# Patient Record
Sex: Female | Born: 1970
Health system: Southern US, Community
[De-identification: ages and names within clinical notes are randomized; demographics above are authoritative.]

## PROBLEM LIST (undated history)

## (undated) DIAGNOSIS — E559 Vitamin D deficiency, unspecified: Secondary | ICD-10-CM

## (undated) DIAGNOSIS — Z98811 Dental restoration status: Secondary | ICD-10-CM

## (undated) DIAGNOSIS — M1990 Unspecified osteoarthritis, unspecified site: Secondary | ICD-10-CM

## (undated) DIAGNOSIS — K219 Gastro-esophageal reflux disease without esophagitis: Secondary | ICD-10-CM

## (undated) DIAGNOSIS — N649 Disorder of breast, unspecified: Secondary | ICD-10-CM

## (undated) HISTORY — DX: Unspecified osteoarthritis, unspecified site: M19.90

## (undated) HISTORY — DX: Vitamin D deficiency, unspecified: E55.9

## (undated) HISTORY — PX: INTRAUTERINE DEVICE (IUD) INSERTION: SHX5877

## (undated) HISTORY — DX: Gastro-esophageal reflux disease without esophagitis: K21.9

---

## 1989-10-19 HISTORY — PX: KNEE ARTHROSCOPY: SHX127

## 1999-01-17 ENCOUNTER — Ambulatory Visit (HOSPITAL_COMMUNITY): Admission: RE | Admit: 1999-01-17 | Discharge: 1999-01-17 | Payer: Self-pay | Admitting: Oral and Maxillofacial Surgery

## 1999-01-17 ENCOUNTER — Encounter: Payer: Self-pay | Admitting: Oral and Maxillofacial Surgery

## 1999-03-11 ENCOUNTER — Other Ambulatory Visit: Admission: RE | Admit: 1999-03-11 | Discharge: 1999-03-11 | Payer: Self-pay | Admitting: Gynecology

## 2000-03-10 ENCOUNTER — Other Ambulatory Visit: Admission: RE | Admit: 2000-03-10 | Discharge: 2000-03-10 | Payer: Self-pay | Admitting: Gynecology

## 2001-03-17 ENCOUNTER — Other Ambulatory Visit: Admission: RE | Admit: 2001-03-17 | Discharge: 2001-03-17 | Payer: Self-pay | Admitting: Gynecology

## 2001-05-06 ENCOUNTER — Encounter: Admission: RE | Admit: 2001-05-06 | Discharge: 2001-05-06 | Payer: Self-pay | Admitting: Emergency Medicine

## 2001-05-06 ENCOUNTER — Encounter: Payer: Self-pay | Admitting: Emergency Medicine

## 2001-05-18 ENCOUNTER — Ambulatory Visit: Admission: RE | Admit: 2001-05-18 | Discharge: 2001-05-18 | Payer: Self-pay | Admitting: Emergency Medicine

## 2002-05-23 ENCOUNTER — Other Ambulatory Visit: Admission: RE | Admit: 2002-05-23 | Discharge: 2002-05-23 | Payer: Self-pay | Admitting: Gynecology

## 2003-06-15 ENCOUNTER — Other Ambulatory Visit: Admission: RE | Admit: 2003-06-15 | Discharge: 2003-06-15 | Payer: Self-pay | Admitting: Gynecology

## 2003-06-23 ENCOUNTER — Emergency Department (HOSPITAL_COMMUNITY): Admission: EM | Admit: 2003-06-23 | Discharge: 2003-06-23 | Payer: Self-pay | Admitting: Emergency Medicine

## 2003-11-02 ENCOUNTER — Encounter: Admission: RE | Admit: 2003-11-02 | Discharge: 2003-11-02 | Payer: Self-pay | Admitting: Emergency Medicine

## 2004-07-29 ENCOUNTER — Other Ambulatory Visit: Admission: RE | Admit: 2004-07-29 | Discharge: 2004-07-29 | Payer: Self-pay | Admitting: Gynecology

## 2005-01-26 ENCOUNTER — Other Ambulatory Visit: Admission: RE | Admit: 2005-01-26 | Discharge: 2005-01-26 | Payer: Self-pay | Admitting: Gynecology

## 2005-06-16 ENCOUNTER — Other Ambulatory Visit: Admission: RE | Admit: 2005-06-16 | Discharge: 2005-06-16 | Payer: Self-pay | Admitting: Gynecology

## 2005-08-03 ENCOUNTER — Encounter: Admission: RE | Admit: 2005-08-03 | Discharge: 2005-08-03 | Payer: Self-pay | Admitting: General Surgery

## 2005-08-09 ENCOUNTER — Encounter: Admission: RE | Admit: 2005-08-09 | Discharge: 2005-08-09 | Payer: Self-pay | Admitting: General Surgery

## 2005-08-27 ENCOUNTER — Other Ambulatory Visit: Admission: RE | Admit: 2005-08-27 | Discharge: 2005-08-27 | Payer: Self-pay | Admitting: Gynecology

## 2006-02-01 ENCOUNTER — Encounter: Admission: RE | Admit: 2006-02-01 | Discharge: 2006-02-01 | Payer: Self-pay | Admitting: Gynecology

## 2006-07-07 ENCOUNTER — Encounter: Admission: RE | Admit: 2006-07-07 | Discharge: 2006-07-07 | Payer: Self-pay | Admitting: Emergency Medicine

## 2006-09-01 ENCOUNTER — Other Ambulatory Visit: Admission: RE | Admit: 2006-09-01 | Discharge: 2006-09-01 | Payer: Self-pay | Admitting: Gynecology

## 2007-09-08 ENCOUNTER — Other Ambulatory Visit: Admission: RE | Admit: 2007-09-08 | Discharge: 2007-09-08 | Payer: Self-pay | Admitting: Gynecology

## 2008-07-13 ENCOUNTER — Ambulatory Visit: Payer: Self-pay | Admitting: Gynecology

## 2008-07-17 ENCOUNTER — Ambulatory Visit: Payer: Self-pay | Admitting: Gynecology

## 2008-07-17 HISTORY — PX: INTRAUTERINE DEVICE INSERTION: SHX323

## 2008-08-14 ENCOUNTER — Ambulatory Visit: Payer: Self-pay | Admitting: Gynecology

## 2008-09-10 ENCOUNTER — Ambulatory Visit: Payer: Self-pay | Admitting: Gynecology

## 2008-09-10 ENCOUNTER — Encounter: Payer: Self-pay | Admitting: Gynecology

## 2008-09-10 ENCOUNTER — Other Ambulatory Visit: Admission: RE | Admit: 2008-09-10 | Discharge: 2008-09-10 | Payer: Self-pay | Admitting: Gynecology

## 2008-09-11 ENCOUNTER — Ambulatory Visit: Payer: Self-pay | Admitting: Gynecology

## 2009-09-20 ENCOUNTER — Ambulatory Visit: Payer: Self-pay | Admitting: Gynecology

## 2009-09-20 ENCOUNTER — Other Ambulatory Visit: Admission: RE | Admit: 2009-09-20 | Discharge: 2009-09-20 | Payer: Self-pay | Admitting: Gynecology

## 2009-10-23 ENCOUNTER — Encounter: Admission: RE | Admit: 2009-10-23 | Discharge: 2009-10-23 | Payer: Self-pay | Admitting: Gynecology

## 2010-04-17 ENCOUNTER — Ambulatory Visit: Payer: Self-pay | Admitting: Vascular Surgery

## 2010-04-17 ENCOUNTER — Ambulatory Visit: Admission: RE | Admit: 2010-04-17 | Discharge: 2010-04-17 | Payer: Self-pay | Admitting: Internal Medicine

## 2010-04-17 ENCOUNTER — Encounter (INDEPENDENT_AMBULATORY_CARE_PROVIDER_SITE_OTHER): Payer: Self-pay | Admitting: Internal Medicine

## 2010-07-08 ENCOUNTER — Ambulatory Visit: Payer: Self-pay | Admitting: Gynecology

## 2010-07-17 ENCOUNTER — Ambulatory Visit: Payer: Self-pay | Admitting: Gynecology

## 2010-07-23 ENCOUNTER — Ambulatory Visit: Payer: Self-pay | Admitting: Gynecology

## 2010-10-03 ENCOUNTER — Other Ambulatory Visit
Admission: RE | Admit: 2010-10-03 | Discharge: 2010-10-03 | Payer: Self-pay | Source: Home / Self Care | Admitting: Gynecology

## 2010-10-03 ENCOUNTER — Ambulatory Visit: Payer: Self-pay | Admitting: Gynecology

## 2010-10-24 ENCOUNTER — Encounter
Admission: RE | Admit: 2010-10-24 | Discharge: 2010-10-24 | Payer: Self-pay | Source: Home / Self Care | Attending: Gynecology | Admitting: Gynecology

## 2011-09-09 ENCOUNTER — Telehealth: Payer: Self-pay | Admitting: *Deleted

## 2011-09-09 DIAGNOSIS — Z01419 Encounter for gynecological examination (general) (routine) without abnormal findings: Secondary | ICD-10-CM

## 2011-09-09 NOTE — Telephone Encounter (Signed)
Pt called wanting to know if she could have her labs drawn prior to her annual appointment on dec 17.12? If okay please list the labs.

## 2011-09-13 NOTE — Telephone Encounter (Signed)
Labs to be drawn prior to office visit in fasting state: CBC,TSH,BS,FLP,U/A

## 2011-09-14 NOTE — Telephone Encounter (Signed)
Patient informed, orders in pc, appt made.

## 2011-09-25 ENCOUNTER — Ambulatory Visit (INDEPENDENT_AMBULATORY_CARE_PROVIDER_SITE_OTHER): Payer: 59 | Admitting: Gynecology

## 2011-09-25 ENCOUNTER — Encounter: Payer: Self-pay | Admitting: Anesthesiology

## 2011-09-25 DIAGNOSIS — Z01419 Encounter for gynecological examination (general) (routine) without abnormal findings: Secondary | ICD-10-CM

## 2011-09-25 DIAGNOSIS — E079 Disorder of thyroid, unspecified: Secondary | ICD-10-CM

## 2011-09-25 DIAGNOSIS — R823 Hemoglobinuria: Secondary | ICD-10-CM

## 2011-09-25 DIAGNOSIS — O99814 Abnormal glucose complicating childbirth: Secondary | ICD-10-CM

## 2011-09-25 DIAGNOSIS — Z13220 Encounter for screening for lipoid disorders: Secondary | ICD-10-CM

## 2011-09-25 LAB — GLUCOSE, RANDOM: Glucose, Bld: 85 mg/dL (ref 70–99)

## 2011-10-05 ENCOUNTER — Ambulatory Visit (INDEPENDENT_AMBULATORY_CARE_PROVIDER_SITE_OTHER): Payer: 59 | Admitting: Gynecology

## 2011-10-05 ENCOUNTER — Encounter: Payer: Self-pay | Admitting: Gynecology

## 2011-10-05 ENCOUNTER — Other Ambulatory Visit (HOSPITAL_COMMUNITY)
Admission: RE | Admit: 2011-10-05 | Discharge: 2011-10-05 | Disposition: A | Discharge status: Home / Self Care | Payer: 59 | Source: Ambulatory Visit | Attending: Gynecology | Admitting: Gynecology

## 2011-10-05 VITALS — BP 110/70 | Ht 66.5 in | Wt 169.0 lb

## 2011-10-05 DIAGNOSIS — Z01419 Encounter for gynecological examination (general) (routine) without abnormal findings: Secondary | ICD-10-CM | POA: Insufficient documentation

## 2011-10-05 DIAGNOSIS — IMO0001 Reserved for inherently not codable concepts without codable children: Secondary | ICD-10-CM

## 2011-10-05 DIAGNOSIS — Z975 Presence of (intrauterine) contraceptive device: Secondary | ICD-10-CM

## 2011-10-05 DIAGNOSIS — Z8632 Personal history of gestational diabetes: Secondary | ICD-10-CM | POA: Insufficient documentation

## 2011-10-05 NOTE — Progress Notes (Signed)
Patty Hale 19-Apr-1971 [id number]   History:    40 y.o.  for annual exam with no complaints. Patient had a Mirena IUD placed in 2009. Patient 5 no menstrual cycles. Patient has had history of gestational diabetes with her last pregnancy. The week before her visit today she had all the following labs which were normal: CBC, urinalysis, CBC, TSH, and random blood sugar. Patient flu vaccine earlier this year.  Past medical history,surgical history, family history and social history were all reviewed and documented in the EPIC chart.  Gynecologic History No LMP recorded. Patient is not currently having periods (Reason: IUD). Contraception: IUD Last Pap: 2011. Results were: normal Last mammogram: January 2012. Results were: normal  Obstetric History OB History    Grav Para Term Preterm Abortions TAB SAB Ect Mult Living   2 2        2      # Outc Date GA Lbr Len/2nd Wgt Sex Del Anes PTL Lv   1 PAR            2 PAR                ROS:  Was performed and pertinent positives and negatives are included in the history.  Exam: chaperone present  BP 110/70  Ht 5' 6.5" (1.689 m)  Wt 169 lb (76.658 kg)  BMI 26.87 kg/m2  Body mass index is 26.87 kg/(m^2).  General appearance : Well developed well nourished female. No acute distress HEENT: Neck supple, trachea midline, no carotid bruits, no thyroidmegaly Lungs: Clear to auscultation, no rhonchi or wheezes, or rib retractions  Heart: Regular rate and rhythm, no murmurs or gallops Breast:Examined in sitting and supine position were symmetrical in appearance, no palpable masses or tenderness,  no skin retraction, no nipple inversion, no nipple discharge, no skin discoloration, no axillary or supraclavicular lymphadenopathy Abdomen: no palpable masses or tenderness, no rebound or guarding Extremities: no edema or skin discoloration or tenderness  Pelvic:  Bartholin, Urethra, Skene Glands: Within normal limits             Vagina: No gross  lesions or discharge  Cervix: No gross lesions or discharge IUD string not seen  Uterus  anteverted, normal size, shape and consistency, non-tender and mobile  Adnexa  Without masses or tenderness  Anus and perineum  normal   Rectovaginal  normal sphincter tone without palpated masses or tenderness             Hemoccult not done     Assessment/Plan:  40 y.o. female for annual exam unremarkable. Patient scheduled for next mammogram next month. She was instructed to continue monthly self breast examination. She will return to the office later this week for an ultrasound to reassure that the IUD still in place as the IUD string was not seen during the examination today.    Ok Edwards MD, 3:31 PM 10/05/2011

## 2011-10-05 NOTE — Patient Instructions (Signed)
Exercise to Lose Weight Exercise and a healthy diet may help you lose weight. Your doctor may suggest specific exercises. EXERCISE IDEAS AND TIPS  Choose low-cost things you enjoy doing, such as walking, bicycling, or exercising to workout videos.   Take stairs instead of the elevator.   Walk during your lunch break.   Park your car further away from work or school.   Go to a gym or an exercise class.   Start with 5 to 10 minutes of exercise each day. Build up to 30 minutes of exercise 4 to 6 days a week.   Wear shoes with good support and comfortable clothes.   Stretch before and after working out.   Work out until you breathe harder and your heart beats faster.   Drink extra water when you exercise.   Do not do so much that you hurt yourself, feel dizzy, or get very short of breath.  Exercises that burn about 150 calories:  Running 1  miles in 15 minutes.   Playing volleyball for 45 to 60 minutes.   Washing and waxing a car for 45 to 60 minutes.   Playing touch football for 45 minutes.   Walking 1  miles in 35 minutes.   Pushing a stroller 1  miles in 30 minutes.   Playing basketball for 30 minutes.   Raking leaves for 30 minutes.   Bicycling 5 miles in 30 minutes.   Walking 2 miles in 30 minutes.   Dancing for 30 minutes.   Shoveling snow for 15 minutes.   Swimming laps for 20 minutes.   Walking up stairs for 15 minutes.   Bicycling 4 miles in 15 minutes.   Gardening for 30 to 45 minutes.   Jumping rope for 15 minutes.   Washing windows or floors for 45 to 60 minutes.  Document Released: 11/07/2010 Document Revised: 06/17/2011 Document Reviewed: 11/07/2010 [employer] Patient Information 2012 [employer].                                           Dietary therapy for weight gain   INTRODUCTION - The optimal management of overweight and obesity requires a combination of diet, exercise, and behavioral modification. In addition, some patients  eventually require pharmacologic therapy or bariatric surgery. The risk of overweight to the subject should be evaluated before beginning any treatment program. Selection of treatment can then be made using a risk-benefit assessment). The choice of therapy is dependent on several factors including the degree of overweight or obesity and patient preference.  This topic will review the dietary therapy of obesity. Other aspects of treatment are discussed separately. (See "Health hazards associated with obesity in adults" and "Overview of therapy for obesity in adults" and "Drug therapy of obesity" and "Behavioral strategies in the treatment of obesity".) GOALS OF WEIGHT LOSS - It is important to set goals when discussing a dietary weight loss program with an individual patient. An initial weight loss goal of 5 to 7 percent of body weight is realistic for most individuals. The first goal for any overweight individual is to prevent further weight gain and keep body weight stable (within 5 pounds of its current level).  The goal of the clinician is to identify and review with the patient a realistic weight-loss goal. Most patients have a weight loss goal of 30 percent or more below current weight, which   is unrealistic [1].  A successful program will lead to a weight loss of more than 5 percent of initial weight [2]. A weight loss of more than 5 percent can reduce risk factors for cardiovascular disease, such as dyslipidemia, hypertension, and diabetes mellitus [3]. In the Diabetes Prevention Program, a multi-center trial in patients with impaired glucose tolerance, weight loss of 7 percent reduced the rate of progression from impaired glucose tolerance to diabetes by 58 percent [4]. (See "Prediction and prevention of type 2 diabetes mellitus", section on 'Diabetes Prevention Program'.)  Loss of 5 percent of initial body weight and maintenance of this loss is a good medical result, even if the subject does not reach  his or her "dream" weight.  Although an extremely difficult goal to achieve, a body mass index (BMI) between 20 and 25 kg/m2 puts the subject in the lowest risk category (table 1 and figure 1). DIETARY ENERGY Rate of weight loss - The rate of weight loss is directly related to the difference between the subject's energy intake and energy requirements. Reducing caloric intake below expenditure results in a predictable initial rate of weight loss that is related to the energy deficit [5,6]. However, prediction of weight loss for an individual subject can be difficult because of marked intersubject variability in initial body composition, adherence, and energy expenditure [5,7]. Food records are often inaccurate. Most normal-weight people under-report what they eat by 10 to 30 percent, while overweight people under-report by 30 percent or more [8]. In addition, energy requirements are influenced by fidgeting, gender, age, and genetic factors [5,6,9]. As examples: Men lose more weight than women of similar height and weight when they comply with eating any given diet because men have more lean body mass, less percent body fat, and therefore higher energy expenditure.  Older subjects of either sex have a lower energy expenditure and therefore lose weight more slowly than younger subjects; metabolic rate declines by approximately 2 percent per decade (about 100 kcal/decade) [10].  The importance of genetic factors is illustrated by a study of identical female twin pairs who were overfed to induce weight gain [11]. Twelve twin pairs were overfed by 1000 kcal/day for 84 of 100 days. The degree of weight gain at a constant dietary caloric increment varied widely among the twin pairs (from 4.3 to 13.3 kg), in fact, there was three times the variance for both weight and fat mass among the twin pairs when compared with that within the twin pairs. Approximately 22 kcal/kg is required to maintain a kilogram of body weight in  a normal adult. Thus, the expected or calculated energy expenditure for a woman weighing 100 kg is approximately 2200 kcal/day. The variability of 20 percent could give energy needs as high as 2620 kcal/day or as low as 1860 kcal/day. An average deficit of 500 kcal/day should result in an initial weight loss of approximately 0.5 kg/week (1 lb/week). However, after three to six months of weight loss, energy expenditure adaptations occur, which slow the bodyweight response to a given change in energy intake, thereby diminishing ongoing weight loss [7]. There are several methods of formally estimating energy expenditure; we suggest using the WHO criteria (table 2). This method allows a direct estimate of resting metabolic rate (RMR) and calculation of daily energy requirement. The low activity level (1.3 x RMR) includes subjects who lead a sedentary life. The high activity level (1.7 x RMR) applies to those in jobs requiring manual labor or patients with regular daily physical exercise   programs [12]. Maintenance of weight loss - It is important for the overweight subject to understand that achieving and maintaining weight loss is made difficult by the reduction in energy expenditure that is induced by weight loss (figure 2) [13]. Weight loss maintenance is also difficult because of changes in the peripheral hormone signals that regulate appetite. Gastrointestinal peptides, such as ghrelin, which stimulates appetite, and gastric inhibitory polypeptide, which may promote energy storage, increase after diet-induced weight loss. Other circulating mediators that inhibit intake (eg, leptin, peptide YY, cholecystokinin, pancreatic polypeptide) decrease. These hormonal adaptations favoring weight gain persist for at least one year after diet-induced weight loss [14]. (See "Overview of therapy for obesity in adults", section on 'Maintenance of weight loss' and "Pathogenesis of obesity", section on 'Ghrelin'.)  TYPES OF  DIETS - The general consensus is that excess intake of calories from any source, associated with a sedentary lifestyle, causes weight gain and obesity. The goal of dietary therapy, therefore, is to decrease energy intake from food. Conventional diets are defined as those below energy requirements but above 800 kcal/day [15]. These diets fall into four groups: Balanced low-calorie diets/portion-controlled diets  Low-fat diets  Low-carbohydrate diets  Mediterranean diet  Fad diets (diets involving unusual combinations of foods or eating sequences) Commercial weight loss programs and internet-based programs are discussed elsewhere. (See "Behavioral strategies in the treatment of obesity".) Balanced low-calorie diets - Planning a diet requires the selection of a caloric intake and then selection of foods to meet this intake. It is desirable to eat foods with adequate nutrients in addition to protein, carbohydrate, and essential fatty acids. Thus, weight-reducing diets should eliminate alcohol, sugar-containing beverages, and most highly concentrated sweets because they rarely contain adequate amounts of other nutrients besides energy. Breakdown of some protein is to be expected during weight loss. When weight increases as a result of overeating, approximately 75 percent of the extra energy is stored as fat and the remaining 25 percent as lean tissue. If the lean tissue contains 20 percent protein, then 5 percent of the extra weight gain would be protein. Thus, it should be anticipated that during weight loss, at least 5 percent of weight loss will be protein. A desirable feature of any calorie restricted diet, however, is that it results in the lowest possible loss of protein, recognizing that this will not be less than 5 percent of the weight that is lost. Portion-controlled diets - One simple approach to providing a calorie-controlled diet is to use individually packaged foods, such as formula diet drinks  using powdered or liquid formula diets, nutrition bars, frozen food, and pre-packaged meals that can be stored at room temperature as the main source of nutrients. Frozen low-calorie meals containing 250 to 350 kcal/package can be a convenient and nutritious way to do this. We have often recommended the use of formula diets or breakfast bars for breakfast, formula diets or a frozen lunch entree for lunch, and a frozen calorie-controlled entree with additional vegetables for dinner. In this way, it is possible to obtain a calorie-controlled 1000 to 1500 kcal per day diet. In one four-year study this approach resulted in early initial weight loss, which then was maintained [16]. I do not recommend the use of formula diets alone because they do not provide adequate nutritional variety. Low-fat diets - Low-fat diets are another standard strategy to help patients lose weight, and almost all dietary guidelines recommend a reduction in the daily intake of fat to 30 percent of energy intake or less [  17,18]. In a meta-analysis of trials comparing low-fat diets (typically 20 to 25 percent of energy from fats) with a control group consuming a usual diet or a medium fat diet (usually 35 to 40 percent of energy), there was greater weight loss (approximately 3 kg) with low-fat compared with moderate fat diets [19]. In addition, one report noted that people who successfully keep their weight reduced adopt three strategies, one of which is eating a lower fat diet [20]. (See "Dietary fat" and "Etiology and natural history of obesity", section on 'Dietary habits'.) A low-fat dietary pattern with healthy carbohydrates is not associated with weight gain. This was illustrated by the [facility] Dietary Modification Trial of [phone] postmenopausal women over age 50 years who were randomly assigned to a dietary intervention that included group and individual sessions to promote a decrease in fat intake and increases in  fruit, vegetable, and grain consumption (healthy carbohydrates), but did not include weight loss or caloric restriction goals, or a control group which received only dietary educational materials [21]. After an average of 7.5 years of follow-up, the following results were seen: Women in the intervention group lost weight in the first year (mean of 2.2 kg) and maintained lower weight than the control women at 7.5 years (difference of 1.9 kg at one year, and 0.4 kg at 7.5 years).  No tendency toward weight gain was seen in the intervention group overall, or when stratified by age, ethnicity, or body mass index.  Weight loss was related to the level of fat intake and was greatest in women who decreased their percentage of energy from fat the most. A similar, but lesser trend was seen with increased vegetable and fruit intake. A low-fat diet can be implemented in two ways. First, the dietitian can provide the subject with specific menu plans that emphasize the use of reduced fat foods. As one guideline, if a food "melts" in your mouth, it probably has fat in it. Second, subjects can be instructed in counting fat grams as an alternative to counting calories. Fat has 9.4 kcal/g. It is thus very easy to calculate the number of grams of fat a subject can eat for any given level of energy intake. Many experts recommend keeping calories from fat to below 30 percent of total calories. In practical terms, this means eating about 33 g of fat for each 1000 calories in the diet. For simplicity, I use 30 g of fat or less for each 1000 kcal. For a 1500-calorie diet, this would mean about 45 g or less of fat, which can be counted using the nutrition information labels on food packages. Low-carbohydrate diets - Proponents of low-carbohydrate diets have argued that the increasing obesity epidemic may be in part due to low-fat, high-carbohydrate diets. But this may be dependent upon the type of carbohydrates that are eaten, such  as energy dense snacks and sugar or high fructose containing beverages. The carbohydrate content of the diet is an important determinant of short-term (less than two weeks) weight loss. Low (60 to 130 grams of carbohydrates) and very low-carbohydrate diets (0 to <60 grams) have been popular for many years [15]. Restriction of carbohydrates leads to glycogen mobilization and, if carbohydrate intake is less than 50 g/day, ketosis will develop. Rapid weight loss occurs, primarily due to glycogen breakdown and fluid loss rather than fat loss. Low and very low-carbohydrate diets are more effective for short-term weight loss than low-fat diets, although probably not for long-term weight loss. A meta-analysis   of five trials found that the difference in weight loss at six months, favoring the low carbohydrate over low fat diet, was not sustained at 12 months [22]. (See 'Comparison trials' below.) Low-carbohydrate diets may have some other beneficial effects with regard to risk of developing type 2 diabetes mellitus, coronary heart disease, and some cancers, particularly if attention is paid to the type as well as the quantity of carbohydrate. A low-carbohydrate diet can be implemented in two ways, either by reducing the total amount of carbohydrate or by consuming foods with a lower glycemic index or glycemic load (table 3). Glycemic index and load are reviewed separately. (See "Dietary carbohydrates", section on 'Glycemic index'.) If a low-carbohydrate diet is chosen, healthy choices for fat (mono- and polyunsaturated fats) and protein (fish, nuts, legumes, and poultry) should be encouraged because of the association between saturated fat intake and risk of coronary heart disease. During 26 years of follow-up of women in the Nurses' Health Study and 20 years of follow-up of men in the Health Professionals' Follow-up Study, low carbohydrate diets in the highest versus lowest decile for vegetable proteins and fat were  associated with lower all-cause mortality (HR 0.80, 95% CI 0.75-0.85) and cardiovascular mortality (HR 0.77, 95% CI 0.68-0.87) [23]. In contrast, low carbohydrate diets in the highest versus lowest decile for animal protein and fat were associated with higher all-cause (HR 1.23, 95% CI 1.11-1.37) and cardiovascular (HR 1.14, 95% CI 1.01-1.29) mortality. (See "Dietary fat" and "Overview of primary prevention of coronary heart disease and stroke", section on 'Healthy diet'.) High protein diets - Some popular books recommend high protein diets [24]. In one trial, low-fat diets with 12 percent and 25 percent protein content were compared. Weight loss over six months was greater with the higher protein diet (9 versus 5 kg), but the difference was no longer significant at 12 and 24 months [25]. Higher protein diets may improve weight maintenance, as illustrated by the results of a study of 60 subjects randomly assigned to a low fat, high protein versus low-fat, high-carbohydrate diet after completing a four week very low calorie diet [26]. Among the subjects who completed the three-month study (n = 48), the high protein diet group had significantly better weight maintenance (between group difference of 2.3 kg). High dietary protein intake, due to its acid-producing load, increases urinary calcium excretion (with potential risk for bone loss and calcium stone formation) [27]. Urinary calcium excretion does appear to increase when dietary intake of protein increases [27-29], and this could pose a long-term risk for nephrolithiasis. (See "Risk factors for calcium stones in adults", section on 'Dietary risk factors'.) However, two small randomized trials that looked at bone metabolism found evidence that increased dietary protein may decrease bone resorption [28,29]. One of the trials found that increased intestinal absorption of calcium was primarily responsible for the increased urinary excretion of calcium and that the  excreted calcium was not coming from bone [29]. Mediterranean diet - The term Mediterranean diet refers to a dietary pattern that is common in [place] areas of the [place] area. Although there is some variation in Mediterranean diets, there are some common components that include a high level of monounsaturated fat relative to saturated; moderate consumption of alcohol, mainly as wine; a high consumption of vegetables, fruits, legumes, and grains; a moderate consumption of milk and dairy products, mostly in the form of cheese; and a relatively low intake of meat and meat products. A meta-analysis of 12 studies involving eight cohorts found that a   Mediterranean diet was associated with improved health status and reductions in overall mortality, cardiovascular mortality, cancer mortality, and incidence of Parkinson's disease and Alzheimer's disease [30]. (See "Healthy diet in adults", section on 'Mediterranean diet'.) Very low-calorie diets - Diets with energy levels between 200 and 800 kcal/day are called "very low-calorie diets," while those below 200 kcal/day can be termed starvation diets. The basis for these diets was the notion that the lower the calorie intake the more rapid the weight loss, because the energy withdrawn from body fat stores is a function of the energy deficit. Starvation is the ultimate very low-calorie diet and results in the most rapid weight loss. Although once popular, starvation diets are now rarely used for treatment of obesity. Very low-calorie diets have not been shown to be superior to conventional diets for long-term weight loss. In a meta-analysis of six trials comparing very low-calorie diets with conventional low-calorie diets, short-term weight loss was greater with very low-calorie diets (16.1 versus 9.7 versus percent of initial weight), but there was no difference in long-term weight loss (6.3 versus 5.0 percent) [31]. As with all diets, very low-calorie diets  initially result in substantial protein loss that diminishes with time. Other expected effects include reduction in blood pressure and improvement in hyperglycemia in diabetic patients. Subjects adhering to very low-calorie diets usually have a fall in blood pressure, especially during the first week. Antihypertensive drugs, especially calcium channel blockers and diuretics, should usually be discontinued when a very low calorie diet is begun unless moderate to severe hypertension is present.  Most diabetic patients eating very low-calorie diets have marked improvement in hyperglycemia. Blood glucose concentrations fall within the first one to two weeks, and remain lower as long as the diet is continued. Those patients taking less than 50 units of insulin or an oral hypoglycemic drug will usually be able to discontinue therapy [32]. The side effects of very low-calorie diets include hair loss, thinning of the skin, and coldness. These diets are contraindicated for lactating and pregnant women, and in children who require protein for linear growth. As with all diets, there is increased cholesterol mobilization from peripheral fat stores, thus increasing the risk of gallstones. Very low-calorie diets should be reserved for subjects who require rapid weight loss for a specific purpose, such as surgery. The weight regain when the diet is stopped is often rapid, and it is better to take a more sustainable approach than to use a method that cannot be sustained. Comparison trials - The impact of specific dietary composition on weight change remains uncertain. When energy from dietary carbohydrates decreases, energy from fat sources tends to increase. The reverse is also true; when energy from dietary fats decreases, energy from carbohydrate sources tends to increase. The debate has mainly centered on whether low-fat or low-carbohydrate diets can better induce weight loss and sustain it over the long-term. Weight loss  diets - Initial trials evaluating the effect of type of diet (predominantly low-carbohydrate versus low-fat) on weight loss and other outcomes showed that weight loss at six months was approximately 4 kg greater in the very low-carbohydrate group than in the low-fat group [33-35]. Trials lasting for one year, however, did not find a significant difference in weight loss [34,36,37]. A meta-analysis of five trials (including one study not referenced above) found that the difference in weight loss at six months, favoring the low carbohydrate over low fat diet, was not sustained at 12 months [22]. In one study, this convergence was mainly due to   regain of weight in the low-carbohydrate group [34]; in another, the convergence was due to ongoing weight loss in the low-fat group (figure 3) [36]. Some of these initial comparison trials of different dietary regimens had important limitations [22]. These included high dropout rates (21 to 48 percent), suboptimal dietary compliance, and limited long-term follow-up. Subsequent trials are larger, of longer duration (lasting one to two years), and have conflicting results with regard to the impact of macronutrient composition on weight loss [38-41]. In contrast, all trials found that dietary adherence is an important determinant of weight loss, independent of macronutrient composition. The following observations illustrate the range of findings in these trials: In one trial, 322 moderately obese subjects (86 percent men) were randomly assigned to a low-fat (restricted calorie), Mediterranean (moderate-fat, restricted calorie, rich in vegetables, low in red meat), or low-carbohydrate (non-restricted-calorie) diet for two years [38]. Adherence rates were higher than those reported in previous trials (95.4 and 84.6 percent at one and two years, respectively). Weight loss was greater with the Mediterranean and low-carbohydrate diets than the low-fat diet (mean weight loss 4.4,  4.7, and 2.9 kg, respectively).  The most favorable effect on lipids (increased HDL and decreased triglycerides and ratio of total cholesterol to HDL) was seen in the low-carbohydrate group. Among subjects with type 2 diabetes, the greatest improvement in glycemic control occurred with the Mediterranean diet. Among all groups, weight loss was greater for those who completed the two year study than for those who withdrew.  Another randomized trial compared four different diets in 311 overweight and obese premenopausal women: very low-carbohydrate (Atkins); macronutrient balance controlling glycemic load ([facility]); general calorie restriction, low-fat ([facility]); and very low-fat ([place]) [39]. In the intention-to-treat analysis at one year, mean weight loss was greater in the Atkins diet group compared with the other groups (4.7, 1.6, 2.2, and 2.6 kg, respectively). Pairwise comparisons showed a significant difference only for Atkins versus Zone.  The most favorable effect on triglycerides and HDL-C was seen in the Atkins group. Dietary adherence rates (77 to 88 percent) were similar among the groups and better than in previous trials. Within each group, adherence was significantly associated with weight loss [42].  In the largest trial to date, 811 overweight and obese adults were randomly assigned to one of four diets based upon macronutrient content: low or high fat (20 to 40 percent), which provided carbohydrate at 35, 45, 55, or 65 percent, and high or average protein (15 to 25 percent) [40]. After six months, mean weight loss in each group was 6 kg. By two years, mean weight loss was 3 to 4 kg, and weight losses remained similar in all groups. Many participants had trouble attaining target levels of macronutrients. Subjects who attended the greatest number of group sessions (most adherent) lost the most weight. Thus, any diet that is adhered to will produce modest weight loss, but adherence rates are low with  most diets. Although a low-carbohydrate diet may be associated with greater short-term weight loss, superior weight loss in the long-term has not been established. The optimal mix of macronutrients likely depends upon individual factors [43]. A principal determinant of weight loss appears to be the degree of adherence to the diet, irrespective of the particular macronutrient composition [37,39,40,42,44,45]. Thus, we suggest choosing a macronutrient mix based upon patient preferences, which may improve long-term adherence. Behavioral modification to improve dietary compliance with any type of diet may have the greatest impact on long-term weight loss. (See "Behavioral strategies in the treatment   of obesity".) Lipids - The observed effects on blood lipids were similar for trials comparing low fat and very low carbohydrate diets [22,33-36,46]; the low-carbohydrate/high-fat diets caused slight increases in HDL, and greater decreases in fasting triglycerides. At 12 to 24 months, however, the favorable effects on HDL persisted [34,36,37,41], while triglyceride levels were either reduced [34,36] or returned to baseline [37]. In a meta-analysis of trials comparing low-carbohydrate and low-fat diet groups, LDL levels were increased in the low-carbohydrate group [22]. There was no clear benefit of either low-fat or low-carbohydrate diet on cardiovascular risks. Favorable changes in HDL cholesterol and triglycerides should be weighed against potential unfavorable changes in LDL cholesterol.  Side effects - Very low-carbohydrate diets may be associated with more frequent side effects than low-fat diets. In one of the trials noted above, a number of symptoms occurred significantly more frequently in the low-carbohydrate compared to the low-fat diet group [33]. These included constipation (68 versus 35 percent), headache (60 versus 40 percent), halitosis (38 versus 8 percent), muscle cramps (35 versus 7 percent), diarrhea (23  versus 7 percent), general weakness (25 versus 8 percent), and rash (13 versus 0 percent) [33]. Despite the higher rate of symptoms, dropout rates in clinical trials have been similar for low-carbohydrate and low-fat diets [34-36]. Some have raised the concern about ketosis that occurs with very low-carbohydrate diets. There is one case report of an obese patient who presented in severe ketoacidosis, having lost 9 kg in one month on the Atkins diet, with intake restricted to meat, cheese, and salads [47]. Aside from her diet and possible mild dehydration due to gastroenteritis, no other cause for her ketoacidosis was identified. Weight maintenance diets - Although many individuals have success losing weight with diet, most subsequently regain much or all of the lost weight. Maintaining weight loss is made difficult by the reduction in energy expenditure that is induced by weight loss. In addition, long-term adherence to restrictive diets is difficult. Exercise and behavioral interventions may help individuals maintain weight loss. These strategies are reviewed in detail elsewhere. (See "Role of physical activity and exercise in obesity", section on 'Maintenance of weight loss' and "Behavioral strategies in the treatment of obesity", section on 'Maintenance of weight loss'.) There is little consensus on the optimal mix of macronutrients to maintain weight loss. The satiating effects of high protein, low glycemic index diets have generated interest in manipulating protein composition and glycemic index in weight maintenance diets. (See 'High protein diets' above and "Dietary carbohydrates", section on 'Effect of glycemic index/glycemic load'.) In a multicenter trial of five ad libitum diets to prevent weight regain over 26 weeks, 773 adults who had successfully lost 8 percent of their body weight on a low calorie diet (800 to 1000 kcal/day), were randomly assigned in a two-by-two factorial design to a high or  low-protein (25 versus 13 percent of total calories), high or low-glycemic index, or to a control diet (moderate protein content) [48]. All diets had a moderate fat content (25 to 30 percent). The achieved protein content was 5 percentage points higher in the high versus low protein groups, and the mean glycemic index was five units lower in the low-glycemic versus high-glycemic index groups. In the intention-to-treat analysis, weight regain during the trial was modestly but significantly greater in the low versus high-protein groups (mean difference 0.93 kg) and in the high versus low-glycemic index groups (mean difference 0.95 kg). Only subjects in the high-protein, low-glycemic index diet group continued to lose weight (mean change -0.38 kg).   The trial was limited by the moderate dropout rate (29 percent) and short-term follow-up (six months). Whether a low glycemic index, high protein diet is associated with long-term weight maintenance is unknown. As discussed above, long-term adherence to a weight maintaining diet is probably the most important determinant of success, and therefore the optimal weight maintaining diet will depend upon preference and individual factors. Role of dietary counseling - Dietary counseling may produce modest, short-term weight losses. This topic is reviewed in detail elsewhere. (See "Behavioral strategies in the treatment of obesity", section on 'Elements of behavioral strategies' and "Behavioral strategies in the treatment of obesity", section on 'Efficacy'.)  Prolonged caloric restriction and longevity - Prolonged caloric restriction improves longevity in rodents and non-human primates [49], but it is not known if the same is true in humans. It is hypothesized that the antiaging effects of caloric restriction are due to reduced energy expenditure resulting in a reduction in production of reactive oxygen species (and therefore a reduction in oxidative damage). In addition, other  metabolic effects associated with caloric restriction, such as improved insulin sensitivity, might also have an antiaging effect. In one trial of 48 sedentary, overweight men and women, six months of caloric restriction, with or without exercise, resulted in significant weight loss as expected [50]. In addition, calorie restriction-mediated reductions in fasting insulin concentrations, core body temperature, serum T3 levels, and oxidative damage to DNA (as reflected by a reduction in DNA fragmentation) were seen, suggesting a possible antiaging effect of the prolonged caloric restriction. INFORMATION FOR PATIENTS - UpToDate offers two types of patient education materials, "The Basics" and "Beyond the Basics." The Basics patient education pieces are written in plain language, at the 5th to 6th grade reading level, and they answer the four or five key questions a patient might have about a given condition. These articles are best for patients who want a general overview and who prefer short, easy-to-read materials. Beyond the Basics patient education pieces are longer, more sophisticated, and more detailed. These articles are written at the 10th to 12th grade reading level and are best for patients who want in-depth information and are comfortable with some medical jargon. Here are the patient education articles that are relevant to this topic. We encourage you to print or e-mail these topics to your patients. (You can also locate patient education articles on a variety of subjects by searching on "patient info" and the keyword(s) of interest.)  Basics topics (see "Patient information: Diet and health (The Basics)" and "Patient information: Weight loss treatments (The Basics)")  Beyond the Basics topics (see "Patient information: Diet and health (Beyond the Basics)" and "Patient information: Weight loss treatments (Beyond the Basics)" and "Patient information: Weight loss surgery (Beyond the Basics)")  SUMMARY  AND RECOMMENDATIONS An initial weight loss goal of 5 to 7 percent of body weight is realistic for most individuals. (See 'Goals of weight loss' above.)  Many types of diets produce modest weight loss. Options include balanced low-calorie, low-fat low-calorie, moderate-fat low calorie, low-carbohydrate diets, and the Mediterranean diet. Dietary adherence is an important predictor of weight loss, irrespective of the type of diet. (See 'Types of diets' above.)  We suggest tailoring a diet that reduces energy intake below energy expenditure to individual patient preferences, rather than focusing on the macronutrient composition of the diet (Grade 2B). (See 'Comparison trials' above.)  If a low-carbohydrate diet is chosen, healthy choices for fat (mono and polyunsaturated) and protein (fish, nuts, legumes, and poultry) should be encouraged. If a   low-fat diet is chosen, the decrease in fat should be accompanied by increases in healthy carbohydrates (fruits, vegetables, whole grains).   

## 2011-10-07 ENCOUNTER — Ambulatory Visit (INDEPENDENT_AMBULATORY_CARE_PROVIDER_SITE_OTHER): Payer: 59

## 2011-10-07 ENCOUNTER — Ambulatory Visit (INDEPENDENT_AMBULATORY_CARE_PROVIDER_SITE_OTHER): Payer: 59 | Admitting: Gynecology

## 2011-10-07 ENCOUNTER — Encounter: Payer: Self-pay | Admitting: Gynecology

## 2011-10-07 VITALS — BP 110/70

## 2011-10-07 DIAGNOSIS — N83209 Unspecified ovarian cyst, unspecified side: Secondary | ICD-10-CM

## 2011-10-07 DIAGNOSIS — Z30431 Encounter for routine checking of intrauterine contraceptive device: Secondary | ICD-10-CM

## 2011-10-07 DIAGNOSIS — N831 Corpus luteum cyst of ovary, unspecified side: Secondary | ICD-10-CM

## 2011-10-07 DIAGNOSIS — IMO0001 Reserved for inherently not codable concepts without codable children: Secondary | ICD-10-CM

## 2011-10-07 DIAGNOSIS — T839XXA Unspecified complication of genitourinary prosthetic device, implant and graft, initial encounter: Secondary | ICD-10-CM

## 2011-10-07 DIAGNOSIS — Z975 Presence of (intrauterine) contraceptive device: Secondary | ICD-10-CM

## 2011-10-07 NOTE — Patient Instructions (Signed)
Follow up u/s in 3 months

## 2011-10-07 NOTE — Progress Notes (Signed)
The patient 40 year old who was seen in the office on December 17 for her annual exam without any complaints. Patient with history of Mirena IUD placed in 2009. At time of her annual exam the IUD string was not seen and she was asked to return to the office today for an ultrasound to confirm that the IUD still in the intrauterine cavity. Her recent lab work consisting of CBC, TSH, random blood sugar and urinalysis were all normal. Pap smear result pending at time of this dictation.  Ultrasound today demonstrated a uterus that measured 10.3 x 7.6 x 5.2 cm with an endometrial stripe of 2.6 mm. IUD was seen in the normal position within the endometrial cavity a right thick wall cyst with internal low level echoes positive color flow on the perimeter with a dimension of 28 x 24 x 26 mm left ovary was normal.  Patient was reassured but nevertheless she'll return back in 3 months for followup ultrasound on the cyst.

## 2011-10-14 ENCOUNTER — Other Ambulatory Visit: Payer: Self-pay | Admitting: Gynecology

## 2011-10-14 DIAGNOSIS — Z1231 Encounter for screening mammogram for malignant neoplasm of breast: Secondary | ICD-10-CM

## 2011-11-02 ENCOUNTER — Ambulatory Visit
Admission: RE | Admit: 2011-11-02 | Discharge: 2011-11-02 | Disposition: A | Discharge status: Home / Self Care | Payer: 59 | Source: Ambulatory Visit | Attending: Gynecology | Admitting: Gynecology

## 2011-11-02 DIAGNOSIS — Z1231 Encounter for screening mammogram for malignant neoplasm of breast: Secondary | ICD-10-CM

## 2012-06-01 ENCOUNTER — Ambulatory Visit (INDEPENDENT_AMBULATORY_CARE_PROVIDER_SITE_OTHER): Payer: 59 | Admitting: Gynecology

## 2012-06-01 ENCOUNTER — Encounter: Payer: Self-pay | Admitting: Gynecology

## 2012-06-01 VITALS — BP 116/70

## 2012-06-01 DIAGNOSIS — N898 Other specified noninflammatory disorders of vagina: Secondary | ICD-10-CM

## 2012-06-01 DIAGNOSIS — Z113 Encounter for screening for infections with a predominantly sexual mode of transmission: Secondary | ICD-10-CM

## 2012-06-01 LAB — WET PREP FOR TRICH, YEAST, CLUE
Trich, Wet Prep: NONE SEEN
Yeast Wet Prep HPF POC: NONE SEEN

## 2012-06-01 MED ORDER — METRONIDAZOLE 0.75 % VA GEL: VAGINAL | Status: DC

## 2012-06-01 NOTE — Patient Instructions (Signed)
Bacterial Vaginosis Bacterial vaginosis (BV) is a vaginal infection where the normal balance of bacteria in the vagina is disrupted. The normal balance is then replaced by an overgrowth of certain bacteria. There are several different kinds of bacteria that can cause BV. BV is the most common vaginal infection in women of childbearing age. CAUSES   The cause of BV is not fully understood. BV develops when there is an increase or imbalance of harmful bacteria.   Some activities or behaviors can upset the normal balance of bacteria in the vagina and put women at increased risk including:   Having a new sex partner or multiple sex partners.   Douching.   Using an intrauterine device (IUD) for contraception.   It is not clear what role sexual activity plays in the development of BV. However, women that have never had sexual intercourse are rarely infected with BV.  Women do not get BV from toilet seats, bedding, swimming pools or from touching objects around them.  SYMPTOMS   Grey vaginal discharge.   A fish-like odor with discharge, especially after sexual intercourse.   Itching or burning of the vagina and vulva.   Burning or pain with urination.   Some women have no signs or symptoms at all.  DIAGNOSIS  Your caregiver must examine the vagina for signs of BV. Your caregiver will perform lab tests and look at the sample of vaginal fluid through a microscope. They will look for bacteria and abnormal cells (clue cells), a pH test higher than 4.5, and a positive amine test all associated with BV.  RISKS AND COMPLICATIONS   Pelvic inflammatory disease (PID).   Infections following gynecology surgery.   Developing HIV.   Developing herpes virus.  TREATMENT  Sometimes BV will clear up without treatment. However, all women with symptoms of BV should be treated to avoid complications, especially if gynecology surgery is planned. Female partners generally do not need to be treated. However,  BV may spread between female sex partners so treatment is helpful in preventing a recurrence of BV.   BV may be treated with antibiotics. The antibiotics come in either pill or vaginal cream forms. Either can be used with nonpregnant or pregnant women, but the recommended dosages differ. These antibiotics are not harmful to the baby.   BV can recur after treatment. If this happens, a second round of antibiotics will often be prescribed.   Treatment is important for pregnant women. If not treated, BV can cause a premature delivery, especially for a pregnant woman who had a premature birth in the past. All pregnant women who have symptoms of BV should be checked and treated.   For chronic reoccurrence of BV, treatment with a type of prescribed gel vaginally twice a week is helpful.  HOME CARE INSTRUCTIONS   Finish all medication as directed by your caregiver.   Do not have sex until treatment is completed.   Tell your sexual partner that you have a vaginal infection. They should see their caregiver and be treated if they have problems, such as a mild rash or itching.   Practice safe sex. Use condoms. Only have 1 sex partner.  PREVENTION  Basic prevention steps can help reduce the risk of upsetting the natural balance of bacteria in the vagina and developing BV:  Do not have sexual intercourse (be abstinent).   Do not douche.   Use all of the medicine prescribed for treatment of BV, even if the signs and symptoms go away.     Tell your sex partner if you have BV. That way, they can be treated, if needed, to prevent reoccurrence.  SEEK MEDICAL CARE IF:   Your symptoms are not improving after 3 days of treatment.   You have increased discharge, pain, or fever.  MAKE SURE YOU:   Understand these instructions.   Will watch your condition.   Will get help right away if you are not doing well or get worse.  FOR MORE INFORMATION  Division of STD Prevention (DSTDP), Centers for Disease  Control and Prevention: www.cdc.gov/std American [employer] Health Association (ASHA): www.ashastd.org  Document Released: 10/05/2005 Document Revised: 09/24/2011 Document Reviewed: 03/28/2009 Exit[employer] Patient Information 2012 [employer]. 

## 2012-06-01 NOTE — Progress Notes (Signed)
Patient presented to the office today stating that for the past month she's had this bubbly vaginal discharge with odor. She has an IUD for contraception and stated her cycles are otherwise regular. She states she is a monogamous relationship.  Exam: Pelvic: Bartholin urethra Skene was within normal limits Vagina: White frothy fishy odor discharge was noted Cervix: As above Bimanual exam: Not done Rectal exam: Not done  Wet prep moderate amount of bacteria few WBC and moderate amount of clear cells and positive amine  GC and Chlamydia culture obtained results pending at time of this dictation  Assessment/plan bacterial vaginosis will be treated with MetroGel to apply each bedtime for 5-7 nights. Will notify patient does any abnormality in the [facility] and Chlamydia culture. She's otherwise scheduled to return back to the office at the end of the year for her annual exam.

## 2012-06-02 LAB — GC/CHLAMYDIA PROBE AMP, GENITAL
Chlamydia, DNA Probe: NEGATIVE
GC Probe Amp, Genital: NEGATIVE

## 2012-07-08 ENCOUNTER — Encounter: Payer: Self-pay | Admitting: Women's Health

## 2012-07-08 ENCOUNTER — Ambulatory Visit (INDEPENDENT_AMBULATORY_CARE_PROVIDER_SITE_OTHER): Payer: 59 | Admitting: Women's Health

## 2012-07-08 DIAGNOSIS — N898 Other specified noninflammatory disorders of vagina: Secondary | ICD-10-CM

## 2012-07-08 LAB — URINALYSIS W MICROSCOPIC + REFLEX CULTURE
Bilirubin Urine: NEGATIVE
Casts: NONE SEEN
Crystals: NONE SEEN
Glucose, UA: NEGATIVE mg/dL
Ketones, ur: NEGATIVE mg/dL
Nitrite: POSITIVE — AB
Protein, ur: NEGATIVE mg/dL
Specific Gravity, Urine: 1.025 (ref 1.005–1.030)
Urobilinogen, UA: 0.2 mg/dL (ref 0.0–1.0)
pH: 6 (ref 5.0–8.0)

## 2012-07-08 LAB — WET PREP FOR TRICH, YEAST, CLUE
Clue Cells Wet Prep HPF POC: NONE SEEN
Trich, Wet Prep: NONE SEEN

## 2012-07-08 MED ORDER — FLUCONAZOLE 150 MG PO TABS: 150.0000 mg | ORAL_TABLET | Freq: Once | ORAL | Status: DC

## 2012-07-08 NOTE — Progress Notes (Signed)
Patient ID: Patty Hale, female   DOB: [birth year], 41 y.o.   MRN: [id number] Presents with the complaint of vaginal discharge with itching for several weeks. Was treated for BV at annual exam in August. Denies any urinary symptoms or fever. Rare cycle with Mirena IUD.  Exam: No CVAT, external genitalia erythematous at introitus, speculum exam copious white curdy discharge-wet prep positive for yeast. UA: Trace leukocytes, positive nitrites, 3-6 WBCs, few bacteria.  Yeast  Lain: Diflucan 150 by mouth x1 dose with refill. Instructed to call if no relief of symptoms. Urine culture pending.

## 2012-07-08 NOTE — Patient Instructions (Addendum)
Monilial Vaginitis Vaginitis in a soreness, swelling and redness (inflammation) of the vagina and vulva. Monilial vaginitis is not a sexually transmitted infection. CAUSES  Yeast vaginitis is caused by yeast (candida) that is normally found in your vagina. With a yeast infection, the candida has overgrown in number to a point that upsets the chemical balance. SYMPTOMS   White, thick vaginal discharge.   Swelling, itching, redness and irritation of the vagina and possibly the lips of the vagina (vulva).   Burning or painful urination.   Painful intercourse.  DIAGNOSIS  Things that may contribute to monilial vaginitis are:  Postmenopausal and virginal states.   Pregnancy.   Infections.   Being tired, sick or stressed, especially if you had monilial vaginitis in the past.   Diabetes. Good control will help lower the chance.   Birth control pills.   Tight fitting garments.   Using bubble bath, feminine sprays, douches or deodorant tampons.   Taking certain medications that kill germs (antibiotics).   Sporadic recurrence can occur if you become ill.  TREATMENT  Your caregiver will give you medication.  There are several kinds of anti monilial vaginal creams and suppositories specific for monilial vaginitis. For recurrent yeast infections, use a suppository or cream in the vagina 2 times a week, or as directed.   Anti-monilial or steroid cream for the itching or irritation of the vulva may also be used. Get your caregiver's permission.   Painting the vagina with methylene blue solution may help if the monilial cream does not work.   Eating yogurt may help prevent monilial vaginitis.  HOME CARE INSTRUCTIONS   Finish all medication as prescribed.   Do not have sex until treatment is completed or after your caregiver tells you it is okay.   Take warm sitz baths.   Do not douche.   Do not use tampons, especially scented ones.   Wear cotton underwear.   Avoid tight  pants and panty hose.   Tell your sexual partner that you have a yeast infection. They should go to their caregiver if they have symptoms such as mild rash or itching.   Your sexual partner should be treated as well if your infection is difficult to eliminate.   Practice safer sex. Use condoms.   Some vaginal medications cause latex condoms to fail. Vaginal medications that harm condoms are:   Cleocin cream.   Butoconazole (Femstat).   Terconazole (Terazol) vaginal suppository.   Miconazole (Monistat) (may be purchased over the counter).  SEEK MEDICAL CARE IF:   You have a temperature by mouth above 102 F (38.9 C).   The infection is getting worse after 2 days of treatment.   The infection is not getting better after 3 days of treatment.   You develop blisters in or around your vagina.   You develop vaginal bleeding, and it is not your menstrual period.   You have pain when you urinate.   You develop intestinal problems.   You have pain with sexual intercourse.  Document Released: 07/15/2005 Document Revised: 09/24/2011 Document Reviewed: 03/29/2009 Exit[employer] Patient Information 2012 [employer]. 

## 2012-07-08 NOTE — Addendum Note (Signed)
Addended by: Harrington Challenger on: 07/08/2012 04:17 PM   Modules accepted: Orders

## 2012-07-10 LAB — URINE CULTURE: Colony Count: 9000

## 2012-10-04 ENCOUNTER — Other Ambulatory Visit: Payer: Self-pay | Admitting: Gynecology

## 2012-10-04 DIAGNOSIS — Z1231 Encounter for screening mammogram for malignant neoplasm of breast: Secondary | ICD-10-CM

## 2012-10-05 ENCOUNTER — Encounter: Payer: 59 | Admitting: Gynecology

## 2012-10-05 ENCOUNTER — Encounter: Payer: Self-pay | Admitting: Gynecology

## 2012-10-05 ENCOUNTER — Ambulatory Visit (INDEPENDENT_AMBULATORY_CARE_PROVIDER_SITE_OTHER): Payer: 59 | Admitting: Gynecology

## 2012-10-05 VITALS — BP 124/78 | Ht 67.25 in | Wt 165.0 lb

## 2012-10-05 DIAGNOSIS — Z01419 Encounter for gynecological examination (general) (routine) without abnormal findings: Secondary | ICD-10-CM

## 2012-10-05 DIAGNOSIS — Z23 Encounter for immunization: Secondary | ICD-10-CM

## 2012-10-05 DIAGNOSIS — Z8632 Personal history of gestational diabetes: Secondary | ICD-10-CM

## 2012-10-05 DIAGNOSIS — R635 Abnormal weight gain: Secondary | ICD-10-CM

## 2012-10-05 LAB — CBC WITH DIFFERENTIAL/PLATELET
Basophils Absolute: 0 10*3/uL (ref 0.0–0.1)
Basophils Relative: 0 % (ref 0–1)
Eosinophils Absolute: 0 10*3/uL (ref 0.0–0.7)
Eosinophils Relative: 1 % (ref 0–5)
HCT: 40.1 % (ref 36.0–46.0)
Hemoglobin: 13 g/dL (ref 12.0–15.0)
Lymphocytes Relative: 33 % (ref 12–46)
Lymphs Abs: 1.6 10*3/uL (ref 0.7–4.0)
MCH: 29 pg (ref 26.0–34.0)
MCHC: 32.4 g/dL (ref 30.0–36.0)
MCV: 89.3 fL (ref 78.0–100.0)
Monocytes Absolute: 0.4 10*3/uL (ref 0.1–1.0)
Monocytes Relative: 9 % (ref 3–12)
Neutro Abs: 2.8 10*3/uL (ref 1.7–7.7)
Neutrophils Relative %: 57 % (ref 43–77)
Platelets: 191 10*3/uL (ref 150–400)
RBC: 4.49 MIL/uL (ref 3.87–5.11)
RDW: 13.4 % (ref 11.5–15.5)
WBC: 4.8 10*3/uL (ref 4.0–10.5)

## 2012-10-05 LAB — TSH: TSH: 2.04 u[IU]/mL (ref 0.350–4.500)

## 2012-10-05 LAB — CHOLESTEROL, TOTAL: Cholesterol: 142 mg/dL (ref 0–200)

## 2012-10-05 NOTE — Addendum Note (Signed)
Addended by: Bertram Savin A on: 10/05/2012 11:22 AM   Modules accepted: Orders

## 2012-10-05 NOTE — Patient Instructions (Addendum)

## 2012-10-05 NOTE — Progress Notes (Signed)
Patty Hale 1971-08-22 [id number]   History:    41 y.o.  for annual gyn exam with no complaints today. Patient has a Mirena IUD that was placed in 2009 and is doing well. Patient with past history of gestational diabetes. Patient has complained of weight gain. Last Pap smear normal 2012. Patient does her monthly self breast examination. Patient scheduled for mammogram next month. Patient is up-to-date on her flu vaccine but not sure of her Tdap  Past medical history,surgical history, family history and social history were all reviewed and documented in the EPIC chart.  Gynecologic History No LMP recorded. Patient is not currently having periods (Reason: IUD). Contraception: IUD Last Pap: 2012. Results were: normal Last mammogram: 2012. Results were: normal  Obstetric History OB History    Grav Para Term Preterm Abortions TAB SAB Ect Mult Living   2 2 2       2      # Outc Date GA Lbr Len/2nd Wgt Sex Del Anes PTL Lv   1 TRM     F SVD  No Yes   2 TRM     M SVD  No Yes       ROS: A ROS was performed and pertinent positives and negatives are included in the history.  GENERAL: No fevers or chills. HEENT: No change in vision, no earache, sore throat or sinus congestion. NECK: No pain or stiffness. CARDIOVASCULAR: No chest pain or pressure. No palpitations. PULMONARY: No shortness of breath, cough or wheeze. GASTROINTESTINAL: No abdominal pain, nausea, vomiting or diarrhea, melena or bright red blood per rectum. GENITOURINARY: No urinary frequency, urgency, hesitancy or dysuria. MUSCULOSKELETAL: No joint or muscle pain, no back pain, no recent trauma. DERMATOLOGIC: No rash, no itching, no lesions. ENDOCRINE: No polyuria, polydipsia, no heat or cold intolerance. No recent change in weight. HEMATOLOGICAL: No anemia or easy bruising or bleeding. NEUROLOGIC: No headache, seizures, numbness, tingling or weakness. PSYCHIATRIC: No depression, no loss of interest in normal activity or change in sleep  pattern.     Exam: chaperone present  BP 124/78  Ht 5' 7.25" (1.708 m)  Wt 165 lb (74.844 kg)  BMI 25.65 kg/m2  Body mass index is 25.65 kg/(m^2).  General appearance : Well developed well nourished female. No acute distress HEENT: Neck supple, trachea midline, no carotid bruits, no thyroidmegaly Lungs: Clear to auscultation, no rhonchi or wheezes, or rib retractions  Heart: Regular rate and rhythm, no murmurs or gallops Breast:Examined in sitting and supine position were symmetrical in appearance, no palpable masses or tenderness,  no skin retraction, no nipple inversion, no nipple discharge, no skin discoloration, no axillary or supraclavicular lymphadenopathy Abdomen: no palpable masses or tenderness, no rebound or guarding Extremities: no edema or skin discoloration or tenderness  Pelvic:  Bartholin, Urethra, Skene Glands: Within normal limits             Vagina: No gross lesions or discharge  Cervix: No gross lesions or discharge  Uterus  anteverted, normal size, shape and consistency, non-tender and mobile  Adnexa  Without masses or tenderness  Anus and perineum  normal   Rectovaginal  normal sphincter tone without palpated masses or tenderness             Hemoccult not indicated   Tdap vaccine administered   Assessment/Plan:  41 y.o. female for annual exam with no complaints. Patient with no past history of abnormal Pap smears. New guidelines discussed. No Pap smear done today. Tdap vaccine administered. The  following labs were ordered: Hemoglobin A1c, screening cholesterol, TSH, CBC and urinalysis. Patient encouraged to do her monthly self breast examination and to follow through with her mammogram next month.    Ok Edwards MD, 11:15 AM 10/05/2012

## 2012-10-06 LAB — URINALYSIS W MICROSCOPIC + REFLEX CULTURE
Bacteria, UA: NONE SEEN
Bilirubin Urine: NEGATIVE
Casts: NONE SEEN
Crystals: NONE SEEN
Glucose, UA: NEGATIVE mg/dL
Hgb urine dipstick: NEGATIVE
Ketones, ur: NEGATIVE mg/dL
Leukocytes, UA: NEGATIVE
Nitrite: NEGATIVE
Protein, ur: NEGATIVE mg/dL
Specific Gravity, Urine: 1.024 (ref 1.005–1.030)
Squamous Epithelial / LPF: NONE SEEN
Urobilinogen, UA: 0.2 mg/dL (ref 0.0–1.0)
pH: 5.5 (ref 5.0–8.0)

## 2012-10-06 LAB — HEMOGLOBIN A1C
Hgb A1c MFr Bld: 5.8 % — ABNORMAL HIGH (ref <5.7)
Mean Plasma Glucose: 120 mg/dL — ABNORMAL HIGH (ref <117)

## 2012-11-04 ENCOUNTER — Ambulatory Visit
Admission: RE | Admit: 2012-11-04 | Discharge: 2012-11-04 | Disposition: A | Discharge status: Home / Self Care | Payer: 59 | Source: Ambulatory Visit | Attending: Gynecology | Admitting: Gynecology

## 2012-11-04 DIAGNOSIS — Z1231 Encounter for screening mammogram for malignant neoplasm of breast: Secondary | ICD-10-CM

## 2013-01-25 ENCOUNTER — Telehealth: Payer: Self-pay | Admitting: *Deleted

## 2013-01-25 ENCOUNTER — Encounter: Payer: Self-pay | Admitting: Gynecology

## 2013-01-25 ENCOUNTER — Ambulatory Visit (INDEPENDENT_AMBULATORY_CARE_PROVIDER_SITE_OTHER): Payer: 59 | Admitting: Gynecology

## 2013-01-25 VITALS — BP 124/82

## 2013-01-25 DIAGNOSIS — N644 Mastodynia: Secondary | ICD-10-CM

## 2013-01-25 DIAGNOSIS — R252 Cramp and spasm: Secondary | ICD-10-CM

## 2013-01-25 NOTE — Telephone Encounter (Signed)
Message copied by Aura Camps on Wed Jan 25, 2013  4:39 PM ------      Message from: Ok Edwards      Created: Wed Jan 25, 2013 11:45 AM       Victorino Dike, please schedule diagnostic mammogram for this patient of the left breast as well as ultrasound:             concern about an indentation of the left breast from the 12 to the 3:00 position that patient brought up to my attention as well. There was a tender area 2 fingerbreadths from the areolar tissue at the 6:00 position There was a a 1-1/2 cm tender nodule was palpated. ------

## 2013-01-25 NOTE — Telephone Encounter (Signed)
Orders placed, breast center will contact pt.

## 2013-01-25 NOTE — Patient Instructions (Signed)
Stop all caffeine products for 3 months. Purchase Vitamin E 600 units take one daily to help with breast tenderness  Breast Tenderness Breast tenderness is a common complaint made by women of all ages. It is also called mastalgia or mastodynia, which means breast pain. The condition can range from mild discomfort to severe pain. It has a variety of causes. Your caregiver will find out the likely cause of your breast tenderness by examining your breasts, asking you about symptoms and perhaps ordering some tests. Breast tenderness usually does not mean you have breast cancer. CAUSES  Breast tenderness has many possible causes. They include:  Premenstrual changes. A week to 10 days before your period, your breasts might ache or feel tender.  Other hormonal causes. These include:  When sexual and physical traits mature (puberty).  Pregnancy.  The time right before and the year after menopause (perimenopause).  The day when it has been 12 months since your last period (menopause).  Large breasts.  Infection (also called mastitis).  Birth control pills.  Breastfeeding. Tenderness can occur if the breasts are overfull with milk or if a milk duct is blocked.  Injury.  Fibrocystic breast changes. This is not cancer (benign). It causes painful breasts that feel lumpy.  Fluid-filled sacs (cysts). Often cysts can be drained in your healthcare provider's office.  Fibroadenoma. This is a tumor that is not cancerous.  Medication side effects. Blood pressure drugs and diuretics (which increase urine flow) sometimes cause breast tenderness.  Previous breast surgery, such as a breast reduction.  Breast cancer. Cancer is rarely the reason breasts are tender. In most women, tenderness is caused by something else. DIAGNOSIS  Several methods can be used to find out why your breasts are tender. They include:  Visual inspection of the breasts.  Examination by hand.  Tests, such  as:  Mammogram.  Ultrasound.  Biopsy.  Lab test of any fluid coming from the nipple.  Blood tests.  MRI. TREATMENT  Treatment is directed to the cause of the breast tenderness from doing nothing for minor discomfort, wearing a good support bra but also may include:  Taking over-the-counter medicines for pain or discomfort as directed by your caregiver.  Prescription medicine for breast tenderness related to:  Premenstrual.  Fibrocystic.  Puberty.  Pregnancy.  Menopause.  Previous breast surgery.  Large breasts.  Antibiotics for infection.  Birth control pills for fibrocystic and premenstrual changes.  More frequent feedings or pumping of the breasts and warm compresses for breast engorgement when nursing.  Cold and warm compresses and a good support bra for most breast injuries.  Breast cysts are sometimes drained with a needle (aspiration) or removed with minor surgery.  Fibroadenomas are usually removed with minor surgery.  Changing or stopping the medicine when it is responsible for causing the breast tenderness.  When breast cancer is present with or without causing pain, it is usually treated with major surgery (with or without radiation) and chemotherapy. HOME CARE INSTRUCTIONS  Breast tenderness often can be handled at home. You can try:  Getting fitted for a new bra that provides more support, especially during exercise.  Wearing a more supportive or sports bra while sleeping when your breasts are very tender.  If you have a breast injury, using an ice pack for 15 to 20 minutes. Wrap the pack in a towel. Do not put the ice pack directly on your breast.  If your breasts are too full of milk as a result of breastfeeding,  try:  Expressing milk either by hand or with a breast pump.  Applying a warm compress for relief.  Taking over-the-counter pain relievers, if this is OK with your caregiver.  Taking medicine that your caregiver prescribes. These  might include antibiotics or birth control pills. Over the long term, your breast tenderness might be eased if you:  Cut down on caffeine.  Reduce the amount of fat in your diet. Also, learn how to do breast examinations at home. This will help you tell when you have an unusual growth or lump that could cause tenderness. And keep a log of the days and times when your breasts are most tender. This will help you and your caregiver find the right solution. SEEK MEDICAL CARE IF:   Any part of your breast is hard, red and hot to the touch. This could be a sign of infection.  Fluid is coming out of your nipples (and you are not breastfeeding). Especially watch for blood or pus.  You have a fever as well as breast tenderness.  You have a new or painful lump in your breast that remains after your period ends.  You have tried to take care of the pain at home, but it has not gone away.  Your breast pain is getting worse. Or, the pain is making it hard to do the things you usually do during your day. Document Released: 09/17/2008 Document Revised: 12/28/2011 Document Reviewed: 09/17/2008 [facility] Patient Information 2013 [place], [place].

## 2013-01-25 NOTE — Progress Notes (Addendum)
42 year old who presented to the office today complaining of several days with complaint of left breast tenderness. She denied any nipple discharge. She has been working out more frequently recently. She also at times and stated that she had some right back thigh cramps. She has a Mirena IUD for contraception. Patient had a 3 the mammogram in January this year because of dense breasts. Her mammogram was normal.  Exam: Both breasts were examined in sitting and supine position both breasts are symmetrical in appearance but there there was concern about an indentation of the left breast from the 12 to the 3:00 position that patient brought up to my attention as well. There was a tender area 2 fingerbreadths from the areolar tissue at the 6:00 position where there was a 1-1/2 cm tender nodule was palpated. There was no supraclavicular or axillary lymphadenopathy.  Right back thigh not tender. On flexion and hyper extension there was no discomfort a listed. On leg raises there was no discomfort elicited. Heel tap sign was negative. Patient stated that she has been doing squats with weights recently.  Assessment/plan problem #1 patient will be sent to the radiology department for a diagnostic mammogram and ultrasound of left breast nodular area. For her occasional side discomfort she was encouraged to drink electrolytes during her exercise activity. She was instructed to cut down or caffeine-containing products and begin taking vitamin E 600 units daily.

## 2013-01-30 NOTE — Telephone Encounter (Signed)
appt 02/07/13

## 2013-02-07 ENCOUNTER — Ambulatory Visit
Admission: RE | Admit: 2013-02-07 | Discharge: 2013-02-07 | Disposition: A | Discharge status: Home / Self Care | Payer: 59 | Source: Ambulatory Visit | Attending: Gynecology | Admitting: Gynecology

## 2013-02-07 DIAGNOSIS — N644 Mastodynia: Secondary | ICD-10-CM

## 2013-05-22 ENCOUNTER — Ambulatory Visit (INDEPENDENT_AMBULATORY_CARE_PROVIDER_SITE_OTHER): Payer: 59 | Admitting: Gynecology

## 2013-05-22 ENCOUNTER — Encounter: Payer: Self-pay | Admitting: Gynecology

## 2013-05-22 VITALS — BP 132/88

## 2013-05-22 DIAGNOSIS — N898 Other specified noninflammatory disorders of vagina: Secondary | ICD-10-CM

## 2013-05-22 DIAGNOSIS — Z113 Encounter for screening for infections with a predominantly sexual mode of transmission: Secondary | ICD-10-CM

## 2013-05-22 LAB — WET PREP FOR TRICH, YEAST, CLUE
Clue Cells Wet Prep HPF POC: NONE SEEN
Trich, Wet Prep: NONE SEEN

## 2013-05-22 MED ORDER — FLUCONAZOLE 100 MG PO TABS: ORAL_TABLET | ORAL | Status: DC

## 2013-05-22 NOTE — Patient Instructions (Signed)
Candidal Vulvovaginitis  Candidal vulvovaginitis is an infection of the vagina and vulva. The vulva is the skin around the opening of the vagina. This may cause itching and discomfort in and around the vagina.   HOME CARE  · Only take medicine as told by your doctor.  · Do not have sex (intercourse) until the infection is healed or as told by your doctor.  · Practice safe sex.  · Tell your sex partner about your infection.  · Do not douche or use tampons.  · Wear cotton underwear. Do not wear tight pants or panty hose.  · Eat yogurt. This may help treat and prevent yeast infections.  GET HELP RIGHT AWAY IF:   · You have a fever.  · Your problems get worse during treatment or do not get better in 3 days.  · You have discomfort, irritation, or itching in your vagina or vulva area.  · You have pain after sex.  · You start to get belly (abdominal) pain.  MAKE SURE YOU:  · Understand these instructions.  · Will watch your condition.  · Will get help right away if you are not doing well or get worse.  Document Released: 01/01/2009 Document Revised: 12/28/2011 Document Reviewed: 01/01/2009  [employer]® Patient Information ©2014 [employer].

## 2013-05-22 NOTE — Progress Notes (Signed)
Patient presented to the office today complaining of vulvar pruritus and vaginal discharge. She describes the discharge as wide. She has a Mirena IUD for contraception. She states she is in a monogamous relationship. She denied any dysuria or frequency. She denied any back pain, fever, chills, nausea or vomiting.  Exam: Pelvic exam: Bartholin's urethra Skene was within normal limits Vagina: Thick white discharge noted an erythematous vaginal mucosa Cervix: No lesions or discharge Bimanual exam: Not done Adnexa: Not examined Rectal exam: Not done   A wet prep moderate yeast  Assessment/plan: Vulvovaginitis will be treated with Diflucan 150 mg one by mouth today. GC and Chlamydia culture obtained results pending at time of this dictation.

## 2013-05-23 LAB — GC/CHLAMYDIA PROBE AMP
CT Probe RNA: NEGATIVE
GC Probe RNA: NEGATIVE

## 2013-06-13 ENCOUNTER — Ambulatory Visit (INDEPENDENT_AMBULATORY_CARE_PROVIDER_SITE_OTHER): Payer: 59 | Admitting: Gynecology

## 2013-06-13 ENCOUNTER — Encounter: Payer: Self-pay | Admitting: Gynecology

## 2013-06-13 ENCOUNTER — Ambulatory Visit (INDEPENDENT_AMBULATORY_CARE_PROVIDER_SITE_OTHER): Payer: 59

## 2013-06-13 VITALS — BP 122/80

## 2013-06-13 DIAGNOSIS — N949 Unspecified condition associated with female genital organs and menstrual cycle: Secondary | ICD-10-CM

## 2013-06-13 DIAGNOSIS — R102 Pelvic and perineal pain: Secondary | ICD-10-CM

## 2013-06-13 DIAGNOSIS — N831 Corpus luteum cyst of ovary, unspecified side: Secondary | ICD-10-CM

## 2013-06-13 DIAGNOSIS — T8332XA Displacement of intrauterine contraceptive device, initial encounter: Secondary | ICD-10-CM

## 2013-06-13 NOTE — Progress Notes (Signed)
Patient presented to the office today stating that when she was having intercourse this past weekend she felt a sharp discomfort in the center of her lower abdomen. Patient is in a monogamous relationship. She denied any vaginal bleeding or any vaginal discharge. She stated last for a few hours and then it resolved overnight. She was asymptomatic today. She does have a Mirena IUD.  Exam: Back: No CVA tenderness Abdomen: Soft nontender no rebound or guarding Pelvic: Bartholin urethra Skene was within normal limits Vagina: No lesions or discharge Cervix: IUD string not seen no lesions seen no discharge Uterus: Anteverted normal size shape and consistency Adnexa: No palpable mass or tenderness Rectal exam: Not done  Ultrasound: Uterus measures 9.7 x 6.3 x 4.8 cm with endometrial stripe of 2.7 mm. A right ovary with thick wall a vascular cyst measuring 23 x 20 x 11 mm with numerous dictation reticular echo pattern suspicious for a corpus luteum cyst. Left ovary otherwise normal.  Assessment/plan: Recent episode of discomfort during intercourse etiology? Patient does have a small right corpus luteum cyst. Patient scheduled to return in December for her annual exam will follow up on the cyst with an ultrasound at that time.

## 2013-08-22 ENCOUNTER — Other Ambulatory Visit: Payer: Self-pay | Admitting: Orthopedic Surgery

## 2013-08-22 DIAGNOSIS — M7551 Bursitis of right shoulder: Secondary | ICD-10-CM

## 2013-08-22 DIAGNOSIS — M778 Other enthesopathies, not elsewhere classified: Secondary | ICD-10-CM

## 2013-08-24 ENCOUNTER — Other Ambulatory Visit: Payer: Self-pay

## 2013-08-27 ENCOUNTER — Ambulatory Visit
Admission: RE | Admit: 2013-08-27 | Discharge: 2013-08-27 | Disposition: A | Discharge status: Home / Self Care | Payer: 59 | Source: Ambulatory Visit | Attending: Orthopedic Surgery | Admitting: Orthopedic Surgery

## 2013-08-27 DIAGNOSIS — M778 Other enthesopathies, not elsewhere classified: Secondary | ICD-10-CM

## 2013-08-27 DIAGNOSIS — M7551 Bursitis of right shoulder: Secondary | ICD-10-CM

## 2013-08-29 ENCOUNTER — Ambulatory Visit
Admission: RE | Admit: 2013-08-29 | Discharge: 2013-08-29 | Disposition: A | Discharge status: Home / Self Care | Payer: 59 | Source: Ambulatory Visit | Attending: Orthopedic Surgery | Admitting: Orthopedic Surgery

## 2013-10-06 ENCOUNTER — Encounter: Payer: Self-pay | Admitting: Gynecology

## 2013-10-06 ENCOUNTER — Ambulatory Visit (INDEPENDENT_AMBULATORY_CARE_PROVIDER_SITE_OTHER): Payer: 59 | Admitting: Gynecology

## 2013-10-06 VITALS — BP 128/72 | Ht 67.0 in | Wt 179.0 lb

## 2013-10-06 DIAGNOSIS — Z01419 Encounter for gynecological examination (general) (routine) without abnormal findings: Secondary | ICD-10-CM

## 2013-10-06 DIAGNOSIS — N898 Other specified noninflammatory disorders of vagina: Secondary | ICD-10-CM

## 2013-10-06 LAB — LIPID PANEL
Cholesterol: 139 mg/dL (ref 0–200)
HDL: 52 mg/dL (ref >39)
LDL Cholesterol: 80 mg/dL (ref 0–99)
Total CHOL/HDL Ratio: 2.7 Ratio
Triglycerides: 33 mg/dL (ref <150)
VLDL: 7 mg/dL (ref 0–40)

## 2013-10-06 LAB — TSH: TSH: 2.145 u[IU]/mL (ref 0.350–4.500)

## 2013-10-06 LAB — CBC WITH DIFFERENTIAL/PLATELET
Basophils Absolute: 0 10*3/uL (ref 0.0–0.1)
Basophils Relative: 0 % (ref 0–1)
Eosinophils Absolute: 0.1 10*3/uL (ref 0.0–0.7)
Eosinophils Relative: 1 % (ref 0–5)
HCT: 37.9 % (ref 36.0–46.0)
Hemoglobin: 12.5 g/dL (ref 12.0–15.0)
Lymphocytes Relative: 31 % (ref 12–46)
Lymphs Abs: 1.7 10*3/uL (ref 0.7–4.0)
MCH: 29.3 pg (ref 26.0–34.0)
MCHC: 33 g/dL (ref 30.0–36.0)
MCV: 88.8 fL (ref 78.0–100.0)
Monocytes Absolute: 0.4 10*3/uL (ref 0.1–1.0)
Monocytes Relative: 8 % (ref 3–12)
Neutro Abs: 3.3 10*3/uL (ref 1.7–7.7)
Neutrophils Relative %: 60 % (ref 43–77)
Platelets: 215 10*3/uL (ref 150–400)
RBC: 4.27 MIL/uL (ref 3.87–5.11)
RDW: 13.5 % (ref 11.5–15.5)
WBC: 5.4 10*3/uL (ref 4.0–10.5)

## 2013-10-06 LAB — URINALYSIS W MICROSCOPIC + REFLEX CULTURE
Bacteria, UA: NONE SEEN
Bilirubin Urine: NEGATIVE
Casts: NONE SEEN
Crystals: NONE SEEN
Glucose, UA: NEGATIVE mg/dL
Ketones, ur: NEGATIVE mg/dL
Leukocytes, UA: NEGATIVE
Nitrite: NEGATIVE
Protein, ur: NEGATIVE mg/dL
Specific Gravity, Urine: 1.025 (ref 1.005–1.030)
Squamous Epithelial / LPF: NONE SEEN
Urobilinogen, UA: 0.2 mg/dL (ref 0.0–1.0)
pH: 6 (ref 5.0–8.0)

## 2013-10-06 LAB — WET PREP FOR TRICH, YEAST, CLUE
Clue Cells Wet Prep HPF POC: NONE SEEN
Trich, Wet Prep: NONE SEEN
Yeast Wet Prep HPF POC: NONE SEEN

## 2013-10-06 LAB — COMPREHENSIVE METABOLIC PANEL
ALT: 13 U/L (ref 0–35)
AST: 16 U/L (ref 0–37)
Albumin: 3.9 g/dL (ref 3.5–5.2)
Alkaline Phosphatase: 61 U/L (ref 39–117)
BUN: 13 mg/dL (ref 6–23)
CO2: 25 mEq/L (ref 19–32)
Calcium: 8.9 mg/dL (ref 8.4–10.5)
Chloride: 108 mEq/L (ref 96–112)
Creat: 1.04 mg/dL (ref 0.50–1.10)
Glucose, Bld: 91 mg/dL (ref 70–99)
Potassium: 4.3 mEq/L (ref 3.5–5.3)
Sodium: 140 mEq/L (ref 135–145)
Total Bilirubin: 0.3 mg/dL (ref 0.3–1.2)
Total Protein: 6.4 g/dL (ref 6.0–8.3)

## 2013-10-06 NOTE — Addendum Note (Signed)
Addended by: Bertram Savin A on: 10/06/2013 10:10 AM   Modules accepted: Orders

## 2013-10-06 NOTE — Progress Notes (Signed)
Patty Hale 23-Jun-1971 [id number]   History:    42 y.o.  for annual gyn exam with a no complaints today. Review of patient's records indicated she was weighing 165 and she is up to 179 pounds now. Patient had a Mirena IUD placement 2009 and has done well. She does have past history of gestational diabetes. Patient with no prior history of abnormal Pap smear. Patient is up-to-date on her flu vaccine and received her Tdap in the past 2 years.  Past medical history,surgical history, family history and social history were all reviewed and documented in the EPIC chart.  Gynecologic History No LMP recorded. Patient is not currently having periods (Reason: IUD). Contraception: IUD Last Pap: 2012. Results were: normal Last mammogram: 2014. Results were: normal  Obstetric History OB History  Gravida Para Term Preterm AB SAB TAB Ectopic Multiple Living  2 2 2       2     # Outcome Date GA Lbr Len/2nd Weight Sex Delivery Anes PTL Lv  2 TRM     M SVD  N Y  1 TRM     F SVD  N Y       ROS: A ROS was performed and pertinent positives and negatives are included in the history.  GENERAL: No fevers or chills. HEENT: No change in vision, no earache, sore throat or sinus congestion. NECK: No pain or stiffness. CARDIOVASCULAR: No chest pain or pressure. No palpitations. PULMONARY: No shortness of breath, cough or wheeze. GASTROINTESTINAL: No abdominal pain, nausea, vomiting or diarrhea, melena or bright red blood per rectum. GENITOURINARY: No urinary frequency, urgency, hesitancy or dysuria. MUSCULOSKELETAL: No joint or muscle pain, no back pain, no recent trauma. DERMATOLOGIC: No rash, no itching, no lesions. ENDOCRINE: No polyuria, polydipsia, no heat or cold intolerance. No recent change in weight. HEMATOLOGICAL: No anemia or easy bruising or bleeding. NEUROLOGIC: No headache, seizures, numbness, tingling or weakness. PSYCHIATRIC: No depression, no loss of interest in normal activity or change in sleep  pattern.     Exam: chaperone present  BP 128/72  Ht 5\' 7"  (1.702 m)  Wt 81.194 kg (179 lb)  BMI 28.03 kg/m2  Body mass index is 28.03 kg/(m^2).  General appearance : Well developed well nourished female. No acute distress HEENT: Neck supple, trachea midline, no carotid bruits, no thyroidmegaly Lungs: Clear to auscultation, no rhonchi or wheezes, or rib retractions  Heart: Regular rate and rhythm, no murmurs or gallops Breast:Examined in sitting and supine position were symmetrical in appearance, no palpable masses or tenderness,  no skin retraction, no nipple inversion, no nipple discharge, no skin discoloration, no axillary or supraclavicular lymphadenopathy Abdomen: no palpable masses or tenderness, no rebound or guarding Extremities: no edema or skin discoloration or tenderness  Pelvic:  Bartholin, Urethra, Skene Glands: Within normal limits             Vagina: No gross lesions or discharge, clear vaginal discharge  Cervix: No gross lesions or discharge  Uterus  anteverted, normal size, shape and consistency, non-tender and mobile  Adnexa  Without masses or tenderness  Anus and perineum  normal   Rectovaginal  normal sphincter tone without palpated masses or tenderness             Hemoccult not indicated   Wet prep: Semen no infection  Assessment/Plan:  42 y.o. female for annual exam who is overdue to have her Mirena IUD change. This is her fifth year. She will schedule appointment next week  to have her IUD changed. One month after placing the Mirena IUD the patient will be seen for followup and we will also included an ultrasound to followup on a prior ovarian cyst that she did not return for followup (August 2014 a right ovary thickwalled vascular cyst that had measured 23 x 20 x 11 mm) . The following labs were ordered: CBC, comprehensive metabolic panel, TSH, fasting lipid profile, and urinalysis.  Note: This dictation was prepared with  Dragon/digital dictation along  withSmart phrase technology. Any transcriptional errors that result from this process are unintentional.   Ok Edwards MD, 9:09 AM 10/06/2013

## 2013-10-06 NOTE — Patient Instructions (Signed)
Levonorgestrel intrauterine device (IUD) What is this medicine? LEVONORGESTREL IUD (LEE voe nor jes trel) is a contraceptive (birth control) device. The device is placed inside the uterus by a healthcare professional. It is used to prevent pregnancy and can also be used to treat heavy bleeding that occurs during your period. Depending on the device, it can be used for 3 to 5 years. This medicine may be used for other purposes; ask your health care provider or pharmacist if you have questions. COMMON BRAND NAME(S): Mirena, Skyla What should I tell my health care provider before I take this medicine? They need to know if you have any of these conditions: -abnormal Pap smear -cancer of the breast, uterus, or cervix -diabetes -endometritis -genital or pelvic infection now or in the past -have more than one sexual partner or your partner has more than one partner -heart disease -history of an ectopic or tubal pregnancy -immune system problems -IUD in place -liver disease or tumor -problems with blood clots or take blood-thinners -use intravenous drugs -uterus of unusual shape -vaginal bleeding that has not been explained -an unusual or allergic reaction to levonorgestrel, other hormones, silicone, or polyethylene, medicines, foods, dyes, or preservatives -pregnant or trying to get pregnant -breast-feeding How should I use this medicine? This device is placed inside the uterus by a health care professional. Talk to your pediatrician regarding the use of this medicine in children. Special care may be needed. Overdosage: If you think you have taken too much of this medicine contact a poison control center or emergency room at once. NOTE: This medicine is only for you. Do not share this medicine with others. What if I miss a dose? This does not apply. What may interact with this medicine? Do not take this medicine with any of the following  medications: -amprenavir -bosentan -fosamprenavir This medicine may also interact with the following medications: -aprepitant -barbiturate medicines for inducing sleep or treating seizures -bexarotene -griseofulvin -medicines to treat seizures like carbamazepine, ethotoin, felbamate, oxcarbazepine, phenytoin, topiramate -modafinil -pioglitazone -rifabutin -rifampin -rifapentine -some medicines to treat HIV infection like atazanavir, indinavir, lopinavir, nelfinavir, tipranavir, ritonavir -St. John's wort -warfarin This list may not describe all possible interactions. Give your health care provider a list of all the medicines, herbs, non-prescription drugs, or dietary supplements you use. Also tell them if you smoke, drink alcohol, or use illegal drugs. Some items may interact with your medicine. What should I watch for while using this medicine? Visit your doctor or health care professional for regular check ups. See your doctor if you or your partner has sexual contact with others, becomes HIV positive, or gets a sexual transmitted disease. This product does not protect you against HIV infection (AIDS) or other sexually transmitted diseases. You can check the placement of the IUD yourself by reaching up to the top of your vagina with clean fingers to feel the threads. Do not pull on the threads. It is a good habit to check placement after each menstrual period. Call your doctor right away if you feel more of the IUD than just the threads or if you cannot feel the threads at all. The IUD may come out by itself. You may become pregnant if the device comes out. If you notice that the IUD has come out use a backup birth control method like condoms and call your health care provider. Using tampons will not change the position of the IUD and are okay to use during your period. What side effects may I   notice from receiving this medicine? Side effects that you should report to your doctor or  health care professional as soon as possible: -allergic reactions like skin rash, itching or hives, swelling of the face, lips, or tongue -fever, flu-like symptoms -genital sores -high blood pressure -no menstrual period for 6 weeks during use -pain, swelling, warmth in the leg -pelvic pain or tenderness -severe or sudden headache -signs of pregnancy -stomach cramping -sudden shortness of breath -trouble with balance, talking, or walking -unusual vaginal bleeding, discharge -yellowing of the eyes or skin Side effects that usually do not require medical attention (report to your doctor or health care professional if they continue or are bothersome): -acne -breast pain -change in sex drive or performance -changes in weight -cramping, dizziness, or faintness while the device is being inserted -headache -irregular menstrual bleeding within first 3 to 6 months of use -nausea This list may not describe all possible side effects. Call your doctor for medical advice about side effects. You may report side effects to FDA at 1-800-FDA-1088. Where should I keep my medicine? This does not apply. NOTE: This sheet is a summary. It may not cover all possible information. If you have questions about this medicine, talk to your doctor, pharmacist, or health care provider.  2014, Elsevier/Gold Standard. (2011-11-05 13:54:04)  

## 2013-10-10 ENCOUNTER — Telehealth: Payer: Self-pay | Admitting: Gynecology

## 2013-10-10 NOTE — Telephone Encounter (Signed)
10/10/13-Pt [employer] ins covers the removal of existing Mirena and insertion of new at 100%, no copay.[facility] [phone]

## 2013-10-11 ENCOUNTER — Encounter: Payer: Self-pay | Admitting: Gynecology

## 2013-10-11 ENCOUNTER — Ambulatory Visit (INDEPENDENT_AMBULATORY_CARE_PROVIDER_SITE_OTHER): Payer: 59 | Admitting: Gynecology

## 2013-10-11 VITALS — BP 130/82

## 2013-10-11 DIAGNOSIS — N83209 Unspecified ovarian cyst, unspecified side: Secondary | ICD-10-CM

## 2013-10-11 DIAGNOSIS — Z30433 Encounter for removal and reinsertion of intrauterine contraceptive device: Secondary | ICD-10-CM

## 2013-10-11 DIAGNOSIS — Z975 Presence of (intrauterine) contraceptive device: Secondary | ICD-10-CM | POA: Insufficient documentation

## 2013-10-11 DIAGNOSIS — N83201 Unspecified ovarian cyst, right side: Secondary | ICD-10-CM

## 2013-10-11 NOTE — Progress Notes (Addendum)
   Patient presented to the office today to have her expired Mirena IUD removed and to replace it with a new one. Review of patient's record also indicated that in August 2014 she had a right ovarian cystic wall a vascular cyst that measured 23 x 20 x 11 mm and did not follow up for ultrasound. When she returns in one month for IUD followup will proceed with an ultrasound as well.                                                                    IUD procedure note       Patient presented to the office today for placement of Mirena IUD. The patient had previously been provided with literature information on this method of contraception. The risks benefits and pros and cons were discussed and all her questions were answered. She is fully aware that this form of contraception is 99% effective and is good for 5 years.  Pelvic exam: Bartholin urethra Skene glands: Within normal limits Vagina: No lesions or discharge Cervix: No lesions or discharge Uterus: retroverted position Adnexa: No masses or tenderness Rectal exam: Not done  The cervix was cleansed with Betadine solution. A single-tooth tenaculum was placed on the anterior cervical lip. The uterus sounded to 7-1/2 centimeter. The IUD was shown to the patient and inserted in a sterile fashion. The IUD string was trimmed. The single-tooth tenaculum was removed. Patient was instructed to return back to the office in one month for follow up.       Lot number [id number]

## 2013-10-11 NOTE — Patient Instructions (Signed)
Transvaginal Ultrasound Transvaginal ultrasound is a pelvic ultrasound, using a metal probe that is placed in the vagina, to look at a women's female organs. Transvaginal ultrasound is a method of seeing inside the pelvis of a woman. The ultrasound machine sends out sound waves from the transducer (probe). These sound waves bounce off body structures (like an echo) to create a picture. The picture shows up on a monitor. It is called transvaginal because the probe is inserted into the vagina. There should be very little discomfort from the vaginal probe. This test can also be used during pregnancy. Endovaginal ultrasound is another name for a transvaginal ultrasound. In a transabdominal ultrasound, the probe is placed on the outside of the belly. This method gives pictures that are lower quality than pictures from the transvaginal technique. Transvaginal ultrasound is used to look for problems of the female genital tract. Some such problems include:  Infertility problems.  Congenital (birth defect) malformations of the uterus and ovaries.  Tumors in the uterus.  Abnormal bleeding.  Ovarian tumors and cysts.  Abscess (inflamed tissue around pus) in the pelvis.  Unexplained abdominal or pelvic pain.  Pelvic infection. DURING PREGNANCY, TRANSVAGINAL ULTRASOUND MAY BE USED TO LOOK AT:  Normal pregnancy.  Ectopic pregnancy (pregnancy outside the uterus).  Fetal heartbeat.  Abnormalities in the pelvis, that are not seen well with transabdominal ultrasound.  Suspected twins or multiples.  Impending miscarriage.  Problems with the cervix (incompetent cervix, not able to stay closed and hold the baby).  When doing an amniocentesis (removing fluid from the pregnancy sac, for testing).  Looking for abnormalities of the baby.  Checking the growth, development, and age of the fetus.  Measuring the amount of fluid in the amniotic sac.  When doing an external version of the baby (moving  baby into correct position).  Evaluating the baby for problems in high risk pregnancies (biophysical profile).  Suspected fetal demise (death). Sometimes a special ultrasound method called Saline Infusion Sonography (SIS) is used for a more accurate look at the uterus. Sterile saline (salt water) is injected into the uterus of non-pregnant patients to see the inside of the uterus better. SIS is not used on pregnant women. The vaginal probe can also assist in obtaining biopsies of abnormal areas, in draining fluid from cysts on the ovary, and in finding IUDs (intrauterine device, birth control) that cannot be located. PREPARATION FOR TEST A transvaginal ultrasound is done with the bladder empty. The transabdominal ultrasound is done with your bladder full. You may be asked to drink several glasses of water before that exam. Sometimes, a transabdominal ultrasound is done just after a transvaginal ultrasound, to look at organs in your abdomen. PROCEDURE  You will lie down on a table, with your knees bent and your feet in foot holders. The probe is covered with a condom. A sterile lubricant is put into the vagina and on the probe. The lubricant helps transmit the sound waves and avoid irritating the vagina. Your caregiver will move the probe inside the vaginal cavity to scan the pelvic structures. A normal test will show a normal pelvis and normal contents. An abnormal test will show abnormalities of the pelvis, placenta, or baby. ABNORMAL RESULTS MAY BE DUE TO:  Growths or tumors in the:  Uterus.  Ovaries.  Vagina.  Other pelvic structures.  Non-cancerous growths of the uterus and ovaries.  Twisting of the ovary, cutting off blood supply to the ovary (ovarian torsion).  Areas of infection, including:  Pelvic  inflammatory disease.  Abscess in the pelvis.  Locating an IUD. PROBLEMS FOUND IN PREGNANT WOMEN MAY INCLUDE:  Ectopic pregnancy (pregnancy outside the uterus).  Multiple  pregnancies.  Early dilation (opening) of the cervix. This may indicate an incompetent cervix and early delivery.  Impending miscarriage.  Fetal death.  Problems with the placenta, including:  Placenta has grown over the opening of the womb (placenta previa).  Placenta has separated early in the womb (placental abruption).  Placenta grows into the muscle of the uterus (placenta accreta).  Tumors of pregnancy, including gestational trophoblastic disease. This is an abnormal pregnancy, with no fetus. The uterus is filled with many grape-like cysts that could sometimes be cancerous.  Incorrect position of the fetus (breech, vertex).  Intrauterine fetal growth retardation (IUGR) (poor growth in the womb).  Fetal abnormalities or infection. RISKS AND COMPLICATIONS There are no known risks to the ultrasound procedure. There is no X-ray used when doing an ultrasound. Document Released: 09/16/2004 Document Revised: 12/28/2011 Document Reviewed: 09/04/2009 [facility] Patient Information 2014 [place], [place]. Ovarian Cyst The ovaries are small organs that are on each side of the uterus. The ovaries are the organs that produce the female hormones, estrogen and progesterone. An ovarian cyst is a sac filled with fluid that can vary in its size. It is normal for a small cyst to form in women who are in the childbearing age and who have menstrual periods. This type of cyst is called a follicle cyst that becomes an ovulation cyst (corpus luteum cyst) after it produces the women's egg. It later goes away on its own if the woman does not become pregnant. There are other kinds of ovarian cysts that may cause problems and may need to be treated. The most serious problem is a cyst with cancer. It should be noted that menopausal women who have an ovarian cyst are at a higher risk of it being a cancer cyst. They should be evaluated very quickly, thoroughly and followed closely. This is especially true in  menopausal women because of the high rate of ovarian cancer in women in menopause. CAUSES AND TYPES OF OVARIAN CYSTS:  FUNCTIONAL CYST: The follicle/corpus luteum cyst is a functional cyst that occurs every month during ovulation with the menstrual cycle. They go away with the next menstrual cycle if the woman does not get pregnant. Usually, there are no symptoms with a functional cyst.  ENDOMETRIOMA CYST: This cyst develops from the lining of the uterus tissue. This cyst gets in or on the ovary. It grows every month from the bleeding during the menstrual period. It is also called a "chocolate cyst" because it becomes filled with blood that turns brown. This cyst can cause pain in the lower abdomen during intercourse and with your menstrual period.  CYSTADENOMA CYST: This cyst develops from the cells on the outside of the ovary. They usually are not cancerous. They can get very big and cause lower abdomen pain and pain with intercourse. This type of cyst can twist on itself, cut off its blood supply and cause severe pain. It also can easily rupture and cause a lot of pain.  DERMOID CYST: This type of cyst is sometimes found in both ovaries. They are found to have different kinds of body tissue in the cyst. The tissue includes skin, teeth, hair, and/or cartilage. They usually do not have symptoms unless they get very big. Dermoid cysts are rarely cancerous.  POLYCYSTIC OVARY: This is a rare condition with hormone problems that produces  many small cysts on both ovaries. The cysts are follicle-like cysts that never produce an egg and become a corpus luteum. It can cause an increase in body weight, infertility, acne, increase in body and facial hair and lack of menstrual periods or rare menstrual periods. Many women with this problem develop type 2 diabetes. The exact cause of this problem is unknown. A polycystic ovary is rarely cancerous.  THECA LUTEIN CYST: Occurs when too much hormone (human chorionic  gonadotropin) is produced and over-stimulates the ovaries to produce an egg. They are frequently seen when doctors stimulate the ovaries for invitro-fertilization (test tube babies).  LUTEOMA CYST: This cyst is seen during pregnancy. Rarely it can cause an obstruction to the birth canal during labor and delivery. They usually go away after delivery. SYMPTOMS   Pelvic pain or pressure.  Pain during sexual intercourse.  Increasing girth (swelling) of the abdomen.  Abnormal menstrual periods.  Increasing pain with menstrual periods.  You stop having menstrual periods and you are not pregnant. DIAGNOSIS  The diagnosis can be made during:  Routine or annual pelvic examination (common).  Ultrasound.  X-ray of the pelvis.  CT Scan.  MRI.  Blood tests. TREATMENT   Treatment may only be to follow the cyst monthly for 2 to 3 months with your caregiver. Many go away on their own, especially functional cysts.  May be aspirated (drained) with a long needle with ultrasound, or by laparoscopy (inserting a tube into the pelvis through a small incision).  The whole cyst can be removed by laparoscopy.  Sometimes the cyst may need to be removed through an incision in the lower abdomen.  Hormone treatment is sometimes used to help dissolve certain cysts.  Birth control pills are sometimes used to help dissolve certain cysts. HOME CARE INSTRUCTIONS  Follow your caregiver's advice regarding:  Medicine.  Follow up visits to evaluate and treat the cyst.  You may need to come back or make an appointment with another caregiver, to find the exact cause of your cyst, if your caregiver is not a gynecologist.  Get your yearly and recommended pelvic examinations and Pap tests.  Let your caregiver know if you have had an ovarian cyst in the past. SEEK MEDICAL CARE IF:   Your periods are late, irregular, they stop, or are painful.  Your stomach (abdomen) or pelvic pain does not go  away.  Your stomach becomes larger or swollen.  You have pressure on your bladder or trouble emptying your bladder completely.  You have painful sexual intercourse.  You have feelings of fullness, pressure, or discomfort in your stomach.  You lose weight for no apparent reason.  You feel generally ill.  You become constipated.  You lose your appetite.  You develop acne.  You have an increase in body and facial hair.  You are gaining weight, without changing your exercise and eating habits.  You think you are pregnant. SEEK IMMEDIATE MEDICAL CARE IF:   You have increasing abdominal pain.  You feel sick to your stomach (nausea) and/or vomit.  You develop a fever that comes on suddenly.  You develop abdominal pain during a bowel movement.  Your menstrual periods become heavier than usual. Document Released: 10/05/2005 Document Revised: 12/28/2011 Document Reviewed: 08/08/2009 [facility] Patient Information 2014 [place], [place]. Levonorgestrel intrauterine device (IUD) What is this medicine? LEVONORGESTREL IUD (LEE voe nor jes trel) is a contraceptive (birth control) device. The device is placed inside the uterus by a healthcare professional. It is used  to prevent pregnancy and can also be used to treat heavy bleeding that occurs during your period. Depending on the device, it can be used for 3 to 5 years. This medicine may be used for other purposes; ask your health care provider or pharmacist if you have questions. COMMON BRAND NAME(S): Gretta Cool What should I tell my health care provider before I take this medicine? They need to know if you have any of these conditions: -abnormal Pap smear -cancer of the breast, uterus, or cervix -diabetes -endometritis -genital or pelvic infection now or in the past -have more than one sexual partner or your partner has more than one partner -heart disease -history of an ectopic or tubal pregnancy -immune system problems -IUD  in place -liver disease or tumor -problems with blood clots or take blood-thinners -use intravenous drugs -uterus of unusual shape -vaginal bleeding that has not been explained -an unusual or allergic reaction to levonorgestrel, other hormones, silicone, or polyethylene, medicines, foods, dyes, or preservatives -pregnant or trying to get pregnant -breast-feeding How should I use this medicine? This device is placed inside the uterus by a health care professional. Talk to your pediatrician regarding the use of this medicine in children. Special care may be needed. Overdosage: If you think you have taken too much of this medicine contact a poison control center or emergency room at once. NOTE: This medicine is only for you. Do not share this medicine with others. What if I miss a dose? This does not apply. What may interact with this medicine? Do not take this medicine with any of the following medications: -amprenavir -bosentan -fosamprenavir This medicine may also interact with the following medications: -aprepitant -barbiturate medicines for inducing sleep or treating seizures -bexarotene -griseofulvin -medicines to treat seizures like carbamazepine, ethotoin, felbamate, oxcarbazepine, phenytoin, topiramate -modafinil -pioglitazone -rifabutin -rifampin -rifapentine -some medicines to treat HIV infection like atazanavir, indinavir, lopinavir, nelfinavir, tipranavir, ritonavir -St. John's wort -warfarin This list may not describe all possible interactions. Give your health care provider a list of all the medicines, herbs, non-prescription drugs, or dietary supplements you use. Also tell them if you smoke, drink alcohol, or use illegal drugs. Some items may interact with your medicine. What should I watch for while using this medicine? Visit your doctor or health care professional for regular check ups. See your doctor if you or your partner has sexual contact with others, becomes  HIV positive, or gets a sexual transmitted disease. This product does not protect you against HIV infection (AIDS) or other sexually transmitted diseases. You can check the placement of the IUD yourself by reaching up to the top of your vagina with clean fingers to feel the threads. Do not pull on the threads. It is a good habit to check placement after each menstrual period. Call your doctor right away if you feel more of the IUD than just the threads or if you cannot feel the threads at all. The IUD may come out by itself. You may become pregnant if the device comes out. If you notice that the IUD has come out use a backup birth control method like condoms and call your health care provider. Using tampons will not change the position of the IUD and are okay to use during your period. What side effects may I notice from receiving this medicine? Side effects that you should report to your doctor or health care professional as soon as possible: -allergic reactions like skin rash, itching or hives, swelling of the face,  lips, or tongue -fever, flu-like symptoms -genital sores -high blood pressure -no menstrual period for 6 weeks during use -pain, swelling, warmth in the leg -pelvic pain or tenderness -severe or sudden headache -signs of pregnancy -stomach cramping -sudden shortness of breath -trouble with balance, talking, or walking -unusual vaginal bleeding, discharge -yellowing of the eyes or skin Side effects that usually do not require medical attention (report to your doctor or health care professional if they continue or are bothersome): -acne -breast pain -change in sex drive or performance -changes in weight -cramping, dizziness, or faintness while the device is being inserted -headache -irregular menstrual bleeding within first 3 to 6 months of use -nausea This list may not describe all possible side effects. Call your doctor for medical advice about side effects. You may report  side effects to FDA at 1-800-FDA-1088. Where should I keep my medicine? This does not apply. NOTE: This sheet is a summary. It may not cover all possible information. If you have questions about this medicine, talk to your doctor, pharmacist, or health care provider.  2014, [employer] Standard. (2011-11-05 13:54:04)

## 2013-10-13 ENCOUNTER — Other Ambulatory Visit: Payer: Self-pay

## 2013-10-13 DIAGNOSIS — Z1231 Encounter for screening mammogram for malignant neoplasm of breast: Secondary | ICD-10-CM

## 2013-10-16 ENCOUNTER — Encounter: Payer: Self-pay | Admitting: Gynecology

## 2013-11-08 ENCOUNTER — Other Ambulatory Visit: Payer: 59

## 2013-11-08 ENCOUNTER — Ambulatory Visit: Payer: 59 | Admitting: Gynecology

## 2013-11-09 ENCOUNTER — Ambulatory Visit
Admission: RE | Admit: 2013-11-09 | Discharge: 2013-11-09 | Disposition: A | Discharge status: Home / Self Care | Payer: 59 | Source: Ambulatory Visit

## 2013-11-09 DIAGNOSIS — Z1231 Encounter for screening mammogram for malignant neoplasm of breast: Secondary | ICD-10-CM

## 2013-11-17 ENCOUNTER — Ambulatory Visit (INDEPENDENT_AMBULATORY_CARE_PROVIDER_SITE_OTHER): Payer: 59

## 2013-11-17 ENCOUNTER — Ambulatory Visit (INDEPENDENT_AMBULATORY_CARE_PROVIDER_SITE_OTHER): Payer: 59 | Admitting: Gynecology

## 2013-11-17 DIAGNOSIS — N83201 Unspecified ovarian cyst, right side: Secondary | ICD-10-CM

## 2013-11-17 DIAGNOSIS — N912 Amenorrhea, unspecified: Secondary | ICD-10-CM

## 2013-11-17 DIAGNOSIS — N83209 Unspecified ovarian cyst, unspecified side: Secondary | ICD-10-CM

## 2013-11-17 DIAGNOSIS — Z8742 Personal history of other diseases of the female genital tract: Secondary | ICD-10-CM

## 2013-11-17 NOTE — Progress Notes (Signed)
   Patient presented to the office today for an ultrasound. Patient was seen in the office December 24 for replacement of her Mirena IUD that had expired. Review of her third had indicated that in August 2014 she had a right ovarian cystic wall a vascular cyst that measured 23 x 20 x 11 mm and did not follow up for ultrasound. She is here for one month followup after the IUD was placed and also to look at the ultrasound to see if this ovarian cyst has resolved. Patient with no complaints today.  Ultrasound uterus measured 9.6 x 6.1 x 4.7 cm with endometrial stripe of 3.1 mm. Normal uterus and ovaries. IUD was seen in the endometrial cavity patient had a small right ovarian follicle measuring 21 mm. There was no free fluid in the cul-de-sac.  Patient was reassured and will return back next year for her annual exam or when necessary.

## 2014-02-12 ENCOUNTER — Encounter (HOSPITAL_BASED_OUTPATIENT_CLINIC_OR_DEPARTMENT_OTHER): Payer: Self-pay | Admitting: *Deleted

## 2014-02-15 ENCOUNTER — Other Ambulatory Visit: Payer: Self-pay | Admitting: Physician Assistant

## 2014-02-15 NOTE — H&P (Signed)
Patty Hale is an 43 y.o. female.   Chief Complaint: Right shoulder partial rotator cuff tear and AC impingement  HPI: 43yo female with c/o right shoulder pain over the last several months seen in outpatient office.  Failing conservative treatments injections, po meds, PT.  MRI shows right shoulder partial rotator cuff tear and AC impingement.    Past Medical History  Diagnosis Date  . NSVD (normal spontaneous vaginal delivery)     X2  . Hx gestational diabetes   . Rotator cuff injury     Past Surgical History  Procedure Laterality Date  . Intrauterine device insertion  07/17/2008    MIRENA  . Knee arthroscopy  1991    right    Family History  Problem Relation Age of Onset  . Hyperlipidemia Mother   . Hypertension Mother   . Hyperlipidemia Father   . Hypertension Maternal Aunt   . Heart disease Maternal Grandfather    Social History:  reports that she has never smoked. She has never used smokeless tobacco. She reports that she does not drink alcohol or use illicit drugs.  Allergies: No Known Allergies   (Not in a hospital admission)  No results found for this or any previous visit (from the past 48 hour(s)). No results found.  Review of Systems  Constitutional: Negative.   HENT: Negative.   Eyes: Negative.   Respiratory: Negative.   Cardiovascular: Negative.   Gastrointestinal: Negative.   Genitourinary: Negative.   Musculoskeletal: Positive for joint pain. Negative for back pain and falls.  Skin: Negative.   Neurological: Negative.   Endo/Heme/Allergies: Negative.   Psychiatric/Behavioral: Negative.     Last menstrual period 01/17/2014. Physical Exam  Constitutional: She is oriented to person, place, and time. She appears well-developed and well-nourished. No distress.  HENT:  Head: Normocephalic and atraumatic.  Nose: Nose normal.  Eyes: Conjunctivae and EOM are normal. Pupils are equal, round, and reactive to light.  Neck: Normal range of motion. Neck  supple.  Cardiovascular: Normal rate, regular rhythm and intact distal pulses.   Respiratory: Effort normal. No respiratory distress.  GI: Soft. She exhibits no distension. There is no tenderness.  Musculoskeletal:       Right shoulder: She exhibits decreased range of motion, tenderness, bony tenderness, pain and decreased strength.  Neurological: She is alert and oriented to person, place, and time.  Skin: Skin is warm and dry.  Psychiatric: She has a normal mood and affect. Her behavior is normal.     Assessment/Plan Right shoulder partial rotator cuff tear and AC impingement  Discussed risks and benefits of right shoulder arthroscopy debridement, acromioplasty, possible distal clavicle excision, possible rotator cuff repair patient wishes to proceed.  This will be performed as outpatient procedure.    Ivin Booty Clarisse Rodriges 02/15/2014, 1:39 PM

## 2014-02-16 ENCOUNTER — Encounter (HOSPITAL_BASED_OUTPATIENT_CLINIC_OR_DEPARTMENT_OTHER): Payer: Self-pay | Admitting: Anesthesiology

## 2014-02-16 ENCOUNTER — Ambulatory Visit (HOSPITAL_BASED_OUTPATIENT_CLINIC_OR_DEPARTMENT_OTHER): Payer: 59 | Admitting: Anesthesiology

## 2014-02-16 ENCOUNTER — Encounter (HOSPITAL_BASED_OUTPATIENT_CLINIC_OR_DEPARTMENT_OTHER): Payer: 59 | Admitting: Anesthesiology

## 2014-02-16 ENCOUNTER — Encounter (HOSPITAL_BASED_OUTPATIENT_CLINIC_OR_DEPARTMENT_OTHER)
Admission: RE | Disposition: A | Discharge status: Home / Self Care | Payer: Self-pay | Source: Ambulatory Visit | Attending: Orthopedic Surgery

## 2014-02-16 ENCOUNTER — Ambulatory Visit (HOSPITAL_BASED_OUTPATIENT_CLINIC_OR_DEPARTMENT_OTHER)
Admission: RE | Admit: 2014-02-16 | Discharge: 2014-02-16 | Disposition: A | Discharge status: Home / Self Care | Payer: 59 | Source: Ambulatory Visit | Attending: Orthopedic Surgery | Admitting: Orthopedic Surgery

## 2014-02-16 DIAGNOSIS — S43429A Sprain of unspecified rotator cuff capsule, initial encounter: Secondary | ICD-10-CM | POA: Insufficient documentation

## 2014-02-16 DIAGNOSIS — M19019 Primary osteoarthritis, unspecified shoulder: Secondary | ICD-10-CM | POA: Insufficient documentation

## 2014-02-16 DIAGNOSIS — M7580 Other shoulder lesions, unspecified shoulder: Secondary | ICD-10-CM

## 2014-02-16 DIAGNOSIS — M25819 Other specified joint disorders, unspecified shoulder: Secondary | ICD-10-CM | POA: Insufficient documentation

## 2014-02-16 HISTORY — PX: SHOULDER ARTHROSCOPY WITH ROTATOR CUFF REPAIR AND SUBACROMIAL DECOMPRESSION: SHX5686

## 2014-02-16 LAB — POCT HEMOGLOBIN-HEMACUE: Hemoglobin: 12.8 g/dL (ref 12.0–15.0)

## 2014-02-16 SURGERY — SHOULDER ARTHROSCOPY WITH ROTATOR CUFF REPAIR AND SUBACROMIAL DECOMPRESSION
Anesthesia: General | Site: Shoulder | Laterality: Right

## 2014-02-16 MED ORDER — PROMETHAZINE HCL 25 MG/ML IJ SOLN
INTRAMUSCULAR | Status: AC
  Filled 2014-02-16: qty 1

## 2014-02-16 MED ORDER — MORPHINE SULFATE 4 MG/ML IJ SOLN
INTRAMUSCULAR | Status: AC
  Filled 2014-02-16: qty 1

## 2014-02-16 MED ORDER — SODIUM CHLORIDE 0.9 % IJ SOLN
INTRAMUSCULAR | Status: AC
  Filled 2014-02-16: qty 10

## 2014-02-16 MED ORDER — PROPOFOL 10 MG/ML IV BOLUS
INTRAVENOUS | Status: DC | PRN
  Administered 2014-02-16: 200 mg via INTRAVENOUS

## 2014-02-16 MED ORDER — METHYLPREDNISOLONE ACETATE 80 MG/ML IJ SUSP
INTRAMUSCULAR | Status: AC
  Filled 2014-02-16: qty 1

## 2014-02-16 MED ORDER — HYDROMORPHONE HCL PF 1 MG/ML IJ SOLN: 0.2500 mg | INTRAMUSCULAR | Status: DC | PRN

## 2014-02-16 MED ORDER — SUCCINYLCHOLINE CHLORIDE 20 MG/ML IJ SOLN
INTRAMUSCULAR | Status: DC | PRN
  Administered 2014-02-16: 100 mg via INTRAVENOUS

## 2014-02-16 MED ORDER — MIDAZOLAM HCL 2 MG/2ML IJ SOLN
1.0000 mg | INTRAMUSCULAR | Status: DC | PRN
  Administered 2014-02-16: 2 mg via INTRAVENOUS

## 2014-02-16 MED ORDER — OXYCODONE HCL 5 MG PO TABS: 5.0000 mg | ORAL_TABLET | Freq: Once | ORAL | Status: DC | PRN

## 2014-02-16 MED ORDER — FENTANYL CITRATE 0.05 MG/ML IJ SOLN
50.0000 ug | INTRAMUSCULAR | Status: DC | PRN
  Administered 2014-02-16: 100 ug via INTRAVENOUS

## 2014-02-16 MED ORDER — LIDOCAINE HCL (CARDIAC) 20 MG/ML IV SOLN
INTRAVENOUS | Status: DC | PRN
  Administered 2014-02-16: 50 mg via INTRAVENOUS

## 2014-02-16 MED ORDER — CEFAZOLIN SODIUM-DEXTROSE 2-3 GM-% IV SOLR
INTRAVENOUS | Status: AC
Start: 2014-02-16 — End: 2014-02-16
  Filled 2014-02-16: qty 50

## 2014-02-16 MED ORDER — ONDANSETRON HCL 4 MG/2ML IJ SOLN
INTRAMUSCULAR | Status: DC | PRN
  Administered 2014-02-16: 4 mg via INTRAVENOUS

## 2014-02-16 MED ORDER — DEXAMETHASONE SODIUM PHOSPHATE 4 MG/ML IJ SOLN
INTRAMUSCULAR | Status: DC | PRN
  Administered 2014-02-16: 10 mg via INTRAVENOUS

## 2014-02-16 MED ORDER — FENTANYL CITRATE 0.05 MG/ML IJ SOLN
INTRAMUSCULAR | Status: AC
  Filled 2014-02-16: qty 6

## 2014-02-16 MED ORDER — MIDAZOLAM HCL 2 MG/2ML IJ SOLN
INTRAMUSCULAR | Status: AC
  Filled 2014-02-16: qty 2

## 2014-02-16 MED ORDER — CEFAZOLIN SODIUM-DEXTROSE 2-3 GM-% IV SOLR
2.0000 g | INTRAVENOUS | Status: AC
  Administered 2014-02-16: 2 g via INTRAVENOUS

## 2014-02-16 MED ORDER — LACTATED RINGERS IV SOLN
INTRAVENOUS | Status: DC
  Administered 2014-02-16: 11:00:00 via INTRAVENOUS

## 2014-02-16 MED ORDER — METHOCARBAMOL 500 MG PO TABS
500.0000 mg | ORAL_TABLET | Freq: Four times a day (QID) | ORAL | Status: DC | PRN
Start: 2014-02-16 — End: 2015-01-24

## 2014-02-16 MED ORDER — SODIUM CHLORIDE 0.9 % IV SOLN
INTRAVENOUS | Status: DC
Start: 2014-02-16 — End: 2014-02-16

## 2014-02-16 MED ORDER — PROMETHAZINE HCL 25 MG/ML IJ SOLN
6.2500 mg | INTRAMUSCULAR | Status: DC | PRN
  Administered 2014-02-16: 6.25 mg via INTRAVENOUS

## 2014-02-16 MED ORDER — BUPIVACAINE-EPINEPHRINE (PF) 0.5% -1:200000 IJ SOLN
INTRAMUSCULAR | Status: AC
  Filled 2014-02-16: qty 30

## 2014-02-16 MED ORDER — DEXAMETHASONE SODIUM PHOSPHATE 10 MG/ML IJ SOLN
INTRAMUSCULAR | Status: DC | PRN
  Administered 2014-02-16: 6 mg

## 2014-02-16 MED ORDER — CHLORHEXIDINE GLUCONATE 4 % EX LIQD: 60.0000 mL | Freq: Once | CUTANEOUS | Status: DC

## 2014-02-16 MED ORDER — OXYCODONE HCL 5 MG PO TABS: ORAL_TABLET | ORAL | Status: DC

## 2014-02-16 MED ORDER — OXYCODONE HCL 5 MG/5ML PO SOLN: 5.0000 mg | Freq: Once | ORAL | Status: DC | PRN

## 2014-02-16 MED ORDER — BUPIVACAINE-EPINEPHRINE (PF) 0.5% -1:200000 IJ SOLN
INTRAMUSCULAR | Status: DC | PRN
  Administered 2014-02-16: 150 mL

## 2014-02-16 MED ORDER — SODIUM CHLORIDE 0.9 % IR SOLN
Status: DC | PRN
  Administered 2014-02-16: 6000 mL

## 2014-02-16 MED ORDER — FENTANYL CITRATE 0.05 MG/ML IJ SOLN
INTRAMUSCULAR | Status: AC
  Filled 2014-02-16: qty 2

## 2014-02-16 SURGICAL SUPPLY — 89 items
ANCH SUT SWLK 19.1X4.75 (Anchor) ×2 IMPLANT
ANCHOR SUT BIO SW 4.75X19.1 (Anchor) ×2 IMPLANT
APL SKNCLS STERI-STRIP NONHPOA (GAUZE/BANDAGES/DRESSINGS) ×1
BENZOIN TINCTURE PRP APPL 2/3 (GAUZE/BANDAGES/DRESSINGS) ×1 IMPLANT
BIT DRILL 3/32DIAX5INL DISPOSE (BIT) IMPLANT
BIT DRILL 3/32DX5IN DISP (BIT) ×1
BIT DRILL 5/64X5 DISP (BIT) ×1 IMPLANT
BLADE 10 SAFETY STRL DISP (BLADE) IMPLANT
BLADE 15 SAFETY STRL DISP (BLADE) ×1 IMPLANT
BLADE 4.2CUDA (BLADE) ×2 IMPLANT
BLADE AVERAGE 25X9 (BLADE) ×1 IMPLANT
BLADE CUTTER GATOR 3.5 (BLADE) IMPLANT
BLADE VORTEX 6.0 (BLADE) IMPLANT
BUR 3.5 LG SPHERICAL (BURR) IMPLANT
BUR EGG 3PK/BX (BURR) ×1 IMPLANT
BUR VERTEX HOODED 4.5 (BURR) IMPLANT
BURR 3.5 LG SPHERICAL (BURR)
CANISTER SUCT 3000ML (MISCELLANEOUS) IMPLANT
CANNULA SHOULDER 7CM (CANNULA) ×3 IMPLANT
CANNULA TWIST IN 8.25X7CM (CANNULA) IMPLANT
CLEANER CAUTERY TIP 5X5 PAD (MISCELLANEOUS) IMPLANT
CUTTER MENISCUS  4.2MM (BLADE)
CUTTER MENISCUS 4.2MM (BLADE) IMPLANT
DECANTER SPIKE VIAL GLASS SM (MISCELLANEOUS) IMPLANT
DRAPE STERI 35X30 U-POUCH (DRAPES) ×2 IMPLANT
DRAPE SURG 17X23 STRL (DRAPES) ×2 IMPLANT
DRAPE U-SHAPE 76X120 STRL (DRAPES) ×4 IMPLANT
DRILL BIT 3/32DIAX5INL DISPOSE (BIT) ×2
DRSG EMULSION OIL 3X3 NADH (GAUZE/BANDAGES/DRESSINGS) ×1 IMPLANT
DRSG PAD ABDOMINAL 8X10 ST (GAUZE/BANDAGES/DRESSINGS) ×2 IMPLANT
DURAPREP 26ML APPLICATOR (WOUND CARE) ×2 IMPLANT
ELECT REM PT RETURN 9FT ADLT (ELECTROSURGICAL) ×2
ELECTRODE REM PT RTRN 9FT ADLT (ELECTROSURGICAL) ×1 IMPLANT
GAUZE SPONGE 4X4 12PLY STRL (GAUZE/BANDAGES/DRESSINGS) ×2 IMPLANT
GLOVE BIO SURGEON STRL SZ7.5 (GLOVE) IMPLANT
GLOVE BIOGEL PI IND STRL 8 (GLOVE) ×2 IMPLANT
GLOVE BIOGEL PI IND STRL 8.5 (GLOVE) IMPLANT
GLOVE BIOGEL PI INDICATOR 8 (GLOVE) ×2
GLOVE BIOGEL PI INDICATOR 8.5 (GLOVE)
GLOVE EXAM NITRILE MD LF STRL (GLOVE) ×1 IMPLANT
GLOVE SURG ORTHO 8.0 STRL STRW (GLOVE) ×2 IMPLANT
GLOVE SURG SS PI 7.0 STRL IVOR (GLOVE) ×1 IMPLANT
GOWN STRL REUS W/ TWL LRG LVL3 (GOWN DISPOSABLE) IMPLANT
GOWN STRL REUS W/ TWL XL LVL3 (GOWN DISPOSABLE) ×1 IMPLANT
GOWN STRL REUS W/TWL LRG LVL3 (GOWN DISPOSABLE)
GOWN STRL REUS W/TWL XL LVL3 (GOWN DISPOSABLE) ×4
MANIFOLD NEPTUNE II (INSTRUMENTS) ×2 IMPLANT
NDL 1/2 CIR CATGUT .05X1.09 (NEEDLE) IMPLANT
NDL SCORPION MULTI FIRE (NEEDLE) ×1 IMPLANT
NEEDLE 1/2 CIR CATGUT .05X1.09 (NEEDLE) IMPLANT
NEEDLE SCORPION MULTI FIRE (NEEDLE) ×4 IMPLANT
NS IRRIG 1000ML POUR BTL (IV SOLUTION) ×2 IMPLANT
PACK ARTHROSCOPY DSU (CUSTOM PROCEDURE TRAY) ×2 IMPLANT
PACK BASIN DAY SURGERY FS (CUSTOM PROCEDURE TRAY) ×2 IMPLANT
PAD CLEANER CAUTERY TIP 5X5 (MISCELLANEOUS)
PAD ORTHO SHOULDER 7X19 LRG (SOFTGOODS) IMPLANT
PENCIL BUTTON HOLSTER BLD 10FT (ELECTRODE) ×1 IMPLANT
SET ARTHROSCOPY TUBING (MISCELLANEOUS) ×2
SET ARTHROSCOPY TUBING LN (MISCELLANEOUS) ×1 IMPLANT
SLING ARM LRG ADULT FOAM STRAP (SOFTGOODS) IMPLANT
SLING ARM MED ADULT FOAM STRAP (SOFTGOODS) IMPLANT
SLING ULTRA II MEDIUM (SOFTGOODS) IMPLANT
SLING ULTRA II SMALL (SOFTGOODS) IMPLANT
SLING ULTRA III MED (ORTHOPEDIC SUPPLIES) ×1 IMPLANT
SPONGE LAP 4X18 X RAY DECT (DISPOSABLE) ×1 IMPLANT
STAPLER VISISTAT 35W (STAPLE) IMPLANT
STRIP CLOSURE SKIN 1/2X4 (GAUZE/BANDAGES/DRESSINGS) ×1 IMPLANT
SUCTION FRAZIER TIP 10 FR DISP (SUCTIONS) ×1 IMPLANT
SUT BONE WAX W31G (SUTURE) IMPLANT
SUT ETHIBOND 2 OS 4 DA (SUTURE) IMPLANT
SUT ETHILON 4 0 PS 2 18 (SUTURE) ×2 IMPLANT
SUT FIBERWIRE #2 38 T-5 BLUE (SUTURE)
SUT MNCRL AB 3-0 PS2 18 (SUTURE) ×1 IMPLANT
SUT PROLENE 3 0 PS 2 (SUTURE) IMPLANT
SUT TICRON 1 T 12 (SUTURE) IMPLANT
SUT TIGER TAPE 7 IN WHITE (SUTURE) ×3 IMPLANT
SUT VIC AB 0 CT1 27 (SUTURE)
SUT VIC AB 0 CT1 27XBRD ANBCTR (SUTURE) IMPLANT
SUT VIC AB 1 CT1 27 (SUTURE) ×2
SUT VIC AB 1 CT1 27XBRD ANBCTR (SUTURE) IMPLANT
SUT VIC AB 2-0 PS2 27 (SUTURE) IMPLANT
SUT VIC AB 2-0 SH 27 (SUTURE) ×2
SUT VIC AB 2-0 SH 27XBRD (SUTURE) IMPLANT
SUTURE FIBERWR #2 38 T-5 BLUE (SUTURE) IMPLANT
SYR BULB 3OZ (MISCELLANEOUS) ×1 IMPLANT
TOWEL OR 17X24 6PK STRL BLUE (TOWEL DISPOSABLE) ×2 IMPLANT
WAND STAR VAC 90 (SURGICAL WAND) IMPLANT
WATER STERILE IRR 1000ML POUR (IV SOLUTION) ×2 IMPLANT
YANKAUER SUCT BULB TIP NO VENT (SUCTIONS) ×1 IMPLANT

## 2014-02-16 NOTE — H&P (View-Only) (Signed)
Patty Hale is an 43 y.o. female.   Chief Complaint: Right shoulder partial rotator cuff tear and AC impingement  HPI: 43yo female with c/o right shoulder pain over the last several months seen in outpatient office.  Failing conservative treatments injections, po meds, PT.  MRI shows right shoulder partial rotator cuff tear and AC impingement.    Past Medical History  Diagnosis Date  . NSVD (normal spontaneous vaginal delivery)     X2  . Hx gestational diabetes   . Rotator cuff injury     Past Surgical History  Procedure Laterality Date  . Intrauterine device insertion  07/17/2008    MIRENA  . Knee arthroscopy  1991    right    Family History  Problem Relation Age of Onset  . Hyperlipidemia Mother   . Hypertension Mother   . Hyperlipidemia Father   . Hypertension Maternal Aunt   . Heart disease Maternal Grandfather    Social History:  reports that she has never smoked. She has never used smokeless tobacco. She reports that she does not drink alcohol or use illicit drugs.  Allergies: No Known Allergies   (Not in a hospital admission)  No results found for this or any previous visit (from the past 48 hour(s)). No results found.  Review of Systems  Constitutional: Negative.   HENT: Negative.   Eyes: Negative.   Respiratory: Negative.   Cardiovascular: Negative.   Gastrointestinal: Negative.   Genitourinary: Negative.   Musculoskeletal: Positive for joint pain. Negative for back pain and falls.  Skin: Negative.   Neurological: Negative.   Endo/Heme/Allergies: Negative.   Psychiatric/Behavioral: Negative.     Last menstrual period 01/17/2014. Physical Exam  Constitutional: She is oriented to person, place, and time. She appears well-developed and well-nourished. No distress.  HENT:  Head: Normocephalic and atraumatic.  Nose: Nose normal.  Eyes: Conjunctivae and EOM are normal. Pupils are equal, round, and reactive to light.  Neck: Normal range of motion. Neck  supple.  Cardiovascular: Normal rate, regular rhythm and intact distal pulses.   Respiratory: Effort normal. No respiratory distress.  GI: Soft. She exhibits no distension. There is no tenderness.  Musculoskeletal:       Right shoulder: She exhibits decreased range of motion, tenderness, bony tenderness, pain and decreased strength.  Neurological: She is alert and oriented to person, place, and time.  Skin: Skin is warm and dry.  Psychiatric: She has a normal mood and affect. Her behavior is normal.     Assessment/Plan Right shoulder partial rotator cuff tear and AC impingement  Discussed risks and benefits of right shoulder arthroscopy debridement, acromioplasty, possible distal clavicle excision, possible rotator cuff repair patient wishes to proceed.  This will be performed as outpatient procedure.    Kyshawn Teal 02/15/2014, 1:39 PM    

## 2014-02-16 NOTE — Anesthesia Preprocedure Evaluation (Addendum)
Anesthesia Evaluation  Patient identified by MRN, date of birth, ID band Patient awake    Reviewed: Allergy & Precautions, H&P , NPO status , Patient's Chart, lab work & pertinent test results  History of Anesthesia Complications Negative for: history of anesthetic complications  Airway Mallampati: I TM Distance: >3 FB Neck ROM: full    Dental  (+) Teeth Intact, Dental Advidsory Given   Pulmonary neg pulmonary ROS,    Pulmonary exam normal       Cardiovascular negative cardio ROS      Neuro/Psych negative neurological ROS  negative psych ROS   GI/Hepatic negative GI ROS, Neg liver ROS,   Endo/Other  negative endocrine ROS  Renal/GU negative Renal ROS     Musculoskeletal   Abdominal Normal abdominal exam  (+)   Peds  Hematology   Anesthesia Other Findings   Reproductive/Obstetrics negative OB ROS                          Anesthesia Physical Anesthesia Plan  ASA: I  Anesthesia Plan:    Post-op Pain Management: MAC Combined w/ Regional for Post-op pain   Induction:   Airway Management Planned:   Additional Equipment:   Intra-op Plan:   Post-operative Plan:   Informed Consent: I have reviewed the patients History and Physical, chart, labs and discussed the procedure including the risks, benefits and alternatives for the proposed anesthesia with the patient or authorized representative who has indicated his/her understanding and acceptance.   Dental Advisory Given  Plan Discussed with: Anesthesiologist, CRNA and Surgeon  Anesthesia Plan Comments:        Anesthesia Quick Evaluation

## 2014-02-16 NOTE — Progress Notes (Signed)
Assisted Dr. Singer with right, ultrasound guided, interscalene  block. Side rails up, monitors on throughout procedure. See vital signs in flow sheet. Tolerated Procedure well. 

## 2014-02-16 NOTE — Transfer of Care (Signed)
Immediate Anesthesia Transfer of Care Note  Patient: Patty Hale  Procedure(s) Performed: Procedure(s) with comments: RIGHT SHOULDER ARTHROSCOPY WITH DEBRIDEMENT EXTENSIVE/DISTAL CLAVICULECTOMY; SUBACROMIAL DECOMPRESSION;  PARTIAL ACROMIOPLASTY AND OPEN ROTATOR CUFF REPAIR (Right) - ANESTHESIA: GENERAL, PRE/POST OP SCALENE  Patient Location: PACU  Anesthesia Type:General  Level of Consciousness: awake and sedated  Airway & Oxygen Therapy: Patient Spontanous Breathing and Patient connected to face mask oxygen  Post-op Assessment: Report given to PACU RN and Post -op Vital signs reviewed and stable  Post vital signs: Reviewed and stable  Complications: No apparent anesthesia complications

## 2014-02-16 NOTE — Brief Op Note (Signed)
02/16/2014  2:38 PM  PATIENT:  Patty Hale  43 y.o. female  PRE-OPERATIVE DIAGNOSIS:  RIGHT DISORDER ARTICULAR CARTILAGE-SHOULDER/DEGENERATIVE ARTHRITIS PRIMARY, DISORDERS OF BURSAE/TENDONS IN SHOULDER REGION UNSPECIFIED, COMPLETE  POST-OPERATIVE DIAGNOSIS:  RIGHT DISORDER ARTICULAR DISORDERS OF BURSAE/TENDONS IN SHOULDER REGION UNSPECIFIED, COMPLETE  PROCEDURE:  Procedure(s) with comments: RIGHT SHOULDER ARTHROSCOPY WITH DEBRIDEMENT EXTENSIVE/DISTAL CLAVICULECTOMY; SUBACROMIAL DECOMPRESSION;  PARTIAL ACROMIOPLASTY AND OPEN ROTATOR CUFF REPAIR (Right) - ANESTHESIA: GENERAL, PRE/POST OP SCALENE  SURGEON:  Surgeon(s) and Role:    * W D Carloyn Manner., MD - Primary  PHYSICIAN ASSISTANT: Margart Sickles, PA-C  ASSISTANTS:    ANESTHESIA:   regional and general  EBL:  Total I/O In: 1500 [I.V.:1500] Out: -   BLOOD ADMINISTERED:none  DRAINS: none   LOCAL MEDICATIONS USED:  NONE  SPECIMEN:  No Specimen  DISPOSITION OF SPECIMEN:  N/A  COUNTS:  YES  TOURNIQUET:  * No tourniquets in log *  DICTATION: .Other Dictation: Dictation Number unknown  PLAN OF CARE: Discharge to home after PACU  PATIENT DISPOSITION:  PACU - hemodynamically stable.   Delay start of Pharmacological VTE agent (>24hrs) due to surgical blood loss or risk of bleeding: not applicable

## 2014-02-16 NOTE — Anesthesia Postprocedure Evaluation (Signed)
Anesthesia Post Note  Patient: Patty Hale  Procedure(s) Performed: Procedure(s) (LRB): RIGHT SHOULDER ARTHROSCOPY WITH DEBRIDEMENT EXTENSIVE/DISTAL CLAVICULECTOMY; SUBACROMIAL DECOMPRESSION;  PARTIAL ACROMIOPLASTY AND OPEN ROTATOR CUFF REPAIR (Right)  Anesthesia type: general  Patient location: PACU  Post pain: Pain level controlled  Post assessment: Patient's Cardiovascular Status Stable  Last Vitals:  Filed Vitals:   02/16/14 1530  BP: 138/82  Pulse: 105  Temp:   Resp: 21    Post vital signs: Reviewed and stable  Level of consciousness: sedated  Complications: No apparent anesthesia complications

## 2014-02-16 NOTE — Anesthesia Procedure Notes (Addendum)
Anesthesia Regional Block:  Interscalene brachial plexus block  Pre-Anesthetic Checklist: ,, timeout performed, Correct Patient, Correct Site, Correct Laterality, Correct Procedure, Correct Position, site marked, Risks and benefits discussed,  Surgical consent,  Pre-op evaluation,  At surgeon's request and post-op pain management  Laterality: Right  Prep: chloraprep       Needles:  Injection technique: Single-shot  Needle Type: Echogenic Stimulator Needle     Needle Length: 5cm 5 cm Needle Gauge: 22 and 22 G    Additional Needles:  Procedures: ultrasound guided (picture in chart) and nerve stimulator Interscalene brachial plexus block  Nerve Stimulator or Paresthesia:  Response: bicep contraction, 0.45 mA,   Additional Responses:   Narrative:  Start time: 02/16/2014 12:27 PM End time: 02/16/2014 12:37 PM Injection made incrementally with aspirations every 5 mL.  Performed by: Personally  Anesthesiologist: J. Adonis Huguenin, MD  Additional Notes: Functioning IV was confirmed and monitors applied.  A 73mm 22ga echogenic arrow stimulator was used. Sterile prep and drape,hand hygiene and sterile gloves were used.Ultrasound guidance: relevant anatomy identified, needle position confirmed, local anesthetic spread visualized around nerve(s)., vascular puncture avoided.  Image printed for medical record.  Negative aspiration and negative test dose prior to incremental administration of local anesthetic. The patient tolerated the procedure well.   Procedure Name: Intubation Performed by: York Grice Pre-anesthesia Checklist: Patient identified, Timeout performed, Emergency Drugs available, Suction available and Patient being monitored Patient Re-evaluated:Patient Re-evaluated prior to inductionOxygen Delivery Method: Circle system utilized Preoxygenation: Pre-oxygenation with 100% oxygen Intubation Type: IV induction Ventilation: Mask ventilation without difficulty Laryngoscope  Size: Miller and 2 Grade View: Grade I Tube type: Oral Tube size: 7.0 mm Number of attempts: 1 Airway Equipment and Method: Patient positioned with wedge pillow and Stylet Placement Confirmation: ETT inserted through vocal cords under direct vision,  breath sounds checked- equal and bilateral and positive ETCO2 Secured at: 22 cm Tube secured with: Tape Dental Injury: Teeth and Oropharynx as per pre-operative assessment

## 2014-02-16 NOTE — Discharge Instructions (Signed)
Diet: As you were doing prior to hospitalization   Activity: Increase activity slowly as tolerated  No lifting or driving for 6 weeks   Shower: May shower without a dressing on post op day #3, NO SOAKING in tub   Dressing: You may change your dressing on post op day #3.  Then change the dressing daily with sterile 4"x4"s gauze dressing    Weight Bearing/Sling:  Remain in sling at all times except shower and pendulum exercises.   To prevent constipation: you may use a stool softener such as -  Colace ( over the counter) 100 mg by mouth twice a day  Drink plenty of fluids ( prune juice may be helpful) and high fiber foods  Miralax ( over the counter) for constipation as needed.   Precautions: If you experience chest pain or shortness of breath - call 911 immediately For transfer to the hospital emergency department!!  If you develop a fever greater that 101 F, purulent drainage from wound, increased redness or drainage from wound, or calf pain -- Call the office   Follow- Up Appointment: Please call for an appointment to be seen in 5 days  [place] - [phone]    Post Anesthesia Home Care Instructions  Activity: Get plenty of rest for the remainder of the day. A responsible adult should stay with you for 24 hours following the procedure.  For the next 24 hours, DO NOT: -Drive a car -Advertising copywriter -Drink alcoholic beverages -Take any medication unless instructed by your physician -Make any legal decisions or sign important papers.  Meals: Start with liquid foods such as gelatin or soup. Progress to regular foods as tolerated. Avoid greasy, spicy, heavy foods. If nausea and/or vomiting occur, drink only clear liquids until the nausea and/or vomiting subsides. Call your physician if vomiting continues.  Special Instructions/Symptoms: Your throat may feel dry or sore from the anesthesia or the breathing tube placed in your throat during surgery. If this causes discomfort,  gargle with warm salt water. The discomfort should disappear within 24 hours. Regional Anesthesia Blocks  1. Numbness or the inability to move the "blocked" extremity may last from 3-48 hours after placement. The length of time depends on the medication injected and your individual response to the medication. If the numbness is not going away after 48 hours, call your surgeon.  2. The extremity that is blocked will need to be protected until the numbness is gone and the  Strength has returned. Because you cannot feel it, you will need to take extra care to avoid injury. Because it may be weak, you may have difficulty moving it or using it. You may not know what position it is in without looking at it while the block is in effect.  3. For blocks in the legs and feet, returning to weight bearing and walking needs to be done carefully. You will need to wait until the numbness is entirely gone and the strength has returned. You should be able to move your leg and foot normally before you try and bear weight or walk. You will need someone to be with you when you first try to ensure you do not fall and possibly risk injury.  4. Bruising and tenderness at the needle site are common side effects and will resolve in a few days.  5. Persistent numbness or new problems with movement should be communicated to the surgeon or the [facility] [phone] [facility] [phone]).

## 2014-02-16 NOTE — Interval H&P Note (Signed)
History and Physical Interval Note:  02/16/2014 12:46 PM  Patty Hale  has presented today for surgery, with the diagnosis of RIGHT DISORDER ARTICULAR CARTILAGE-SHOULDER/DEGENERATIVE ARTHRITIS PRIMARY, DISORDERS OF BURSAE/TENDONS IN SHOULDER REGION UNSPECIFIED, COMPLETE  The various methods of treatment have been discussed with the patient and family. After consideration of risks, benefits and other options for treatment, the patient has consented to  Procedure(s) with comments: RIGHT SHOULDER ARTHROSCOPY WITH DEBRIDEMENT EXTENTSIVE/DISTAL CLAVICULECTOMY/SUBACROMIAL DECOMPRESSION PARTIAL ACROMIOPLASTY/ROTATOR CUFF REPAIR (Right) - ANESTHESIA: GENERAL, PRE/POST OP SCALENE as a surgical intervention .  The patient's history has been reviewed, patient examined, no change in status, stable for surgery.  I have reviewed the patient's chart and labs.  Questions were answered to the patient's satisfaction.     Thera Flake.

## 2014-02-19 NOTE — Op Note (Signed)
NAMECAMYLA, Patty Hale                 ACCOUNT NO.:  [id number]  MEDICAL RECORD NO.:  [id number]  LOCATION:                                 FACILITY:  PHYSICIAN:  Dyke Brackett, M.D.    DATE OF BIRTH:  [birth year]  DATE OF PROCEDURE:  02/16/2014 DATE OF DISCHARGE:  02/16/2014                              OPERATIVE REPORT   PREOPERATIVE DIAGNOSES: 1. Rotator cuff tear, right shoulder. 2. Impingement. 3. Acromioclavicular joint arthritis.  POSTOPERATIVE DIAGNOSES: 1. Rotator cuff tear, right shoulder. 2. Impingement. 3. Acromioclavicular joint arthritis.  OPERATIONS: 1. Open rotator cuff repair with acromioplasty (chronic). 2. Open distal clavicle. 3. Arthroscopic debridement of partial rotator cuff tear.  SURGEON:  Dyke Brackett, M.D.  ASSISTANT:  Margart Sickles, PA-C  ANESTHESIA:  General with nerve block.  ESTIMATED BLOOD LOSS:  Minimal.  DESCRIPTION OF PROCEDURE:  She was __________ beach-chair position intra- articularly.  She had a very thin membrane or cuff left, basically complete tear, probably measured about 3-4 cm in width with intraarticular debridement of the undersurface of the cup.  Remainder of the glenohumeral articulation was normal.  After debridement of cuff tear from the superior surface, we made incision bisecting the acromial interval.  Moderately, severe impingement was noted.  Performed acromioplasty towards the distal clavicle, an 1-1.5 cm of the distal clavicle was excised.  Bursectomy was carried out, __________ edges of the cuff with 15 blade, brought to the superior tuberosity.  Then, we used two 4.5 Arthrex SwiveLocks, double loaded for medial and lateral row repair essentially creating the watertight repair.  Closure of the deltoid was affected with interrupted Tycron including __________ drill holes.  Lightly compressive sterile dressing __________ applied, taken to recovery room in stable condition.     Dyke Brackett,  M.D.   ______________________________ Dyke Brackett, M.D.    WDC/MEDQ  D:  02/16/2014  T:  02/17/2014  Job:  [id number]

## 2014-02-20 ENCOUNTER — Encounter (HOSPITAL_BASED_OUTPATIENT_CLINIC_OR_DEPARTMENT_OTHER): Payer: Self-pay | Admitting: Orthopedic Surgery

## 2014-08-20 ENCOUNTER — Encounter (HOSPITAL_BASED_OUTPATIENT_CLINIC_OR_DEPARTMENT_OTHER): Payer: Self-pay | Admitting: Orthopedic Surgery

## 2014-10-19 HISTORY — PX: BREAST EXCISIONAL BIOPSY: SUR124

## 2014-10-30 ENCOUNTER — Other Ambulatory Visit: Payer: Self-pay

## 2014-10-30 DIAGNOSIS — Z1231 Encounter for screening mammogram for malignant neoplasm of breast: Secondary | ICD-10-CM

## 2014-11-02 ENCOUNTER — Ambulatory Visit (INDEPENDENT_AMBULATORY_CARE_PROVIDER_SITE_OTHER): Payer: 59 | Admitting: Gynecology

## 2014-11-02 ENCOUNTER — Encounter: Payer: Self-pay | Admitting: Gynecology

## 2014-11-02 ENCOUNTER — Other Ambulatory Visit (HOSPITAL_COMMUNITY)
Admission: RE | Admit: 2014-11-02 | Discharge: 2014-11-02 | Disposition: A | Discharge status: Home / Self Care | Payer: 59 | Source: Ambulatory Visit | Attending: Gynecology | Admitting: Gynecology

## 2014-11-02 VITALS — BP 122/84 | Ht 67.0 in | Wt 185.0 lb

## 2014-11-02 DIAGNOSIS — Z01419 Encounter for gynecological examination (general) (routine) without abnormal findings: Secondary | ICD-10-CM | POA: Insufficient documentation

## 2014-11-02 DIAGNOSIS — R635 Abnormal weight gain: Secondary | ICD-10-CM

## 2014-11-02 DIAGNOSIS — Z1151 Encounter for screening for human papillomavirus (HPV): Secondary | ICD-10-CM | POA: Insufficient documentation

## 2014-11-02 LAB — LIPID PANEL
Cholesterol: 153 mg/dL (ref 0–200)
HDL: 49 mg/dL (ref >39)
LDL Cholesterol: 93 mg/dL (ref 0–99)
Total CHOL/HDL Ratio: 3.1 Ratio
Triglycerides: 53 mg/dL (ref <150)
VLDL: 11 mg/dL (ref 0–40)

## 2014-11-02 LAB — CBC WITH DIFFERENTIAL/PLATELET
Basophils Absolute: 0 10*3/uL (ref 0.0–0.1)
Basophils Relative: 0 % (ref 0–1)
Eosinophils Absolute: 0.1 10*3/uL (ref 0.0–0.7)
Eosinophils Relative: 1 % (ref 0–5)
HCT: 39.6 % (ref 36.0–46.0)
Hemoglobin: 12.7 g/dL (ref 12.0–15.0)
Lymphocytes Relative: 32 % (ref 12–46)
Lymphs Abs: 1.8 10*3/uL (ref 0.7–4.0)
MCH: 28.8 pg (ref 26.0–34.0)
MCHC: 32.1 g/dL (ref 30.0–36.0)
MCV: 89.8 fL (ref 78.0–100.0)
MPV: 10.8 fL (ref 8.6–12.4)
Monocytes Absolute: 0.5 10*3/uL (ref 0.1–1.0)
Monocytes Relative: 9 % (ref 3–12)
Neutro Abs: 3.3 10*3/uL (ref 1.7–7.7)
Neutrophils Relative %: 58 % (ref 43–77)
Platelets: 205 10*3/uL (ref 150–400)
RBC: 4.41 MIL/uL (ref 3.87–5.11)
RDW: 13.4 % (ref 11.5–15.5)
WBC: 5.7 10*3/uL (ref 4.0–10.5)

## 2014-11-02 LAB — TSH: TSH: 2.168 u[IU]/mL (ref 0.350–4.500)

## 2014-11-02 LAB — COMPREHENSIVE METABOLIC PANEL
ALT: 11 U/L (ref 0–35)
AST: 16 U/L (ref 0–37)
Albumin: 3.8 g/dL (ref 3.5–5.2)
Alkaline Phosphatase: 71 U/L (ref 39–117)
BUN: 11 mg/dL (ref 6–23)
CO2: 25 mEq/L (ref 19–32)
Calcium: 9.2 mg/dL (ref 8.4–10.5)
Chloride: 106 mEq/L (ref 96–112)
Creat: 1.11 mg/dL — ABNORMAL HIGH (ref 0.50–1.10)
Glucose, Bld: 80 mg/dL (ref 70–99)
Potassium: 4.7 mEq/L (ref 3.5–5.3)
Sodium: 140 mEq/L (ref 135–145)
Total Bilirubin: 0.4 mg/dL (ref 0.2–1.2)
Total Protein: 6.6 g/dL (ref 6.0–8.3)

## 2014-11-02 NOTE — Progress Notes (Signed)
Patty Hale 1971-10-03 [id number]   History:    44 y.o.  for annual gyn exam with no complaints today. Last year patient had her Mirena IUD changed. She's having very light if any menstrual cycles. Patient's flu vaccine is up-to-date. Patient with no past history of any abnormal Pap smears.  Past medical history,surgical history, family history and social history were all reviewed and documented in the EPIC chart.  Gynecologic History No LMP recorded. Patient is not currently having periods (Reason: IUD). Contraception: IUD Last Pap: 2012. Results were: normal Last mammogram: 2015. Results were: normal  Obstetric History OB History  Gravida Para Term Preterm AB SAB TAB Ectopic Multiple Living  2 2 2       2     # Outcome Date GA Lbr Len/2nd Weight Sex Delivery Anes PTL Lv  2 Term     M Vag-Spont  N Y  1 Term     F Vag-Spont  N Y       ROS: A ROS was performed and pertinent positives and negatives are included in the history.  GENERAL: No fevers or chills. HEENT: No change in vision, no earache, sore throat or sinus congestion. NECK: No pain or stiffness. CARDIOVASCULAR: No chest pain or pressure. No palpitations. PULMONARY: No shortness of breath, cough or wheeze. GASTROINTESTINAL: No abdominal pain, nausea, vomiting or diarrhea, melena or bright red blood per rectum. GENITOURINARY: No urinary frequency, urgency, hesitancy or dysuria. MUSCULOSKELETAL: No joint or muscle pain, no back pain, no recent trauma. DERMATOLOGIC: No rash, no itching, no lesions. ENDOCRINE: No polyuria, polydipsia, no heat or cold intolerance. No recent change in weight. HEMATOLOGICAL: No anemia or easy bruising or bleeding. NEUROLOGIC: No headache, seizures, numbness, tingling or weakness. PSYCHIATRIC: No depression, no loss of interest in normal activity or change in sleep pattern.     Exam: chaperone present  BP 122/84 mmHg  Ht 5\' 7"  (1.702 m)  Wt 185 lb (83.915 kg)  BMI 28.97 kg/m2  Body mass  index is 28.97 kg/(m^2).  General appearance : Well developed well nourished female. No acute distress HEENT: Neck supple, trachea midline, no carotid bruits, no thyroidmegaly Lungs: Clear to auscultation, no rhonchi or wheezes, or rib retractions  Heart: Regular rate and rhythm, no murmurs or gallops Breast:Examined in sitting and supine position were symmetrical in appearance, no palpable masses or tenderness,  no skin retraction, no nipple inversion, no nipple discharge, no skin discoloration, no axillary or supraclavicular lymphadenopathy Abdomen: no palpable masses or tenderness, no rebound or guarding Extremities: no edema or skin discoloration or tenderness  Pelvic:  Bartholin, Urethra, Skene Glands: Within normal limits             Vagina: No gross lesions or discharge  Cervix: No gross lesions or discharge, IUD visualized  Uterus  anteverted, normal size, shape and consistency, non-tender and mobile  Adnexa  Without masses or tenderness  Anus and perineum  normal   Rectovaginal  normal sphincter tone without palpated masses or tenderness             Hemoccult not indicated     Assessment/Plan:  44 y.o. female for annual exam doing well with Mirena IUD having very light if any menstrual cycles. Patient was instructed to do her monthly breast exam. Patient scheduled for mammogram this month. The following labs were ordered: CBC, fasting lipid profile, compresses a metabolic panel, TSH, and urinalysis. Pap smear was done today. Flu vaccine up-to-date.    MD, 9:31 AM 11/02/2014

## 2014-11-03 LAB — URINALYSIS W MICROSCOPIC + REFLEX CULTURE
Bilirubin Urine: NEGATIVE
Casts: NONE SEEN
Crystals: NONE SEEN
Glucose, UA: NEGATIVE mg/dL
Ketones, ur: NEGATIVE mg/dL
Leukocytes, UA: NEGATIVE
Nitrite: NEGATIVE
Protein, ur: NEGATIVE mg/dL
Specific Gravity, Urine: 1.025 (ref 1.005–1.030)
Urobilinogen, UA: 0.2 mg/dL (ref 0.0–1.0)
pH: 6 (ref 5.0–8.0)

## 2014-11-04 LAB — URINE CULTURE: Colony Count: 65000

## 2014-11-05 LAB — CYTOLOGY - PAP

## 2014-11-12 ENCOUNTER — Ambulatory Visit
Admission: RE | Admit: 2014-11-12 | Discharge: 2014-11-12 | Disposition: A | Discharge status: Home / Self Care | Payer: 59 | Source: Ambulatory Visit

## 2014-11-12 ENCOUNTER — Other Ambulatory Visit: Payer: Self-pay

## 2014-11-12 DIAGNOSIS — Z1231 Encounter for screening mammogram for malignant neoplasm of breast: Secondary | ICD-10-CM

## 2014-11-14 ENCOUNTER — Other Ambulatory Visit: Payer: Self-pay | Admitting: Gynecology

## 2014-11-14 DIAGNOSIS — R928 Other abnormal and inconclusive findings on diagnostic imaging of breast: Secondary | ICD-10-CM

## 2014-11-19 ENCOUNTER — Other Ambulatory Visit: Payer: 59

## 2014-11-21 ENCOUNTER — Ambulatory Visit
Admission: RE | Admit: 2014-11-21 | Discharge: 2014-11-21 | Disposition: A | Discharge status: Home / Self Care | Payer: 59 | Source: Ambulatory Visit | Attending: Gynecology | Admitting: Gynecology

## 2014-11-21 ENCOUNTER — Other Ambulatory Visit: Payer: Self-pay | Admitting: Gynecology

## 2014-11-21 DIAGNOSIS — R928 Other abnormal and inconclusive findings on diagnostic imaging of breast: Secondary | ICD-10-CM

## 2014-11-26 ENCOUNTER — Other Ambulatory Visit: Payer: Self-pay | Admitting: Gynecology

## 2014-11-26 DIAGNOSIS — R928 Other abnormal and inconclusive findings on diagnostic imaging of breast: Secondary | ICD-10-CM

## 2014-11-29 ENCOUNTER — Ambulatory Visit
Admission: RE | Admit: 2014-11-29 | Discharge: 2014-11-29 | Disposition: A | Discharge status: Home / Self Care | Payer: 59 | Source: Ambulatory Visit | Attending: Gynecology | Admitting: Gynecology

## 2014-11-29 ENCOUNTER — Other Ambulatory Visit: Payer: Self-pay | Admitting: Gynecology

## 2014-11-29 DIAGNOSIS — R928 Other abnormal and inconclusive findings on diagnostic imaging of breast: Secondary | ICD-10-CM

## 2014-12-18 ENCOUNTER — Other Ambulatory Visit (INDEPENDENT_AMBULATORY_CARE_PROVIDER_SITE_OTHER): Payer: Self-pay | Admitting: General Surgery

## 2014-12-18 DIAGNOSIS — N6022 Fibroadenosis of left breast: Secondary | ICD-10-CM

## 2014-12-24 ENCOUNTER — Encounter: Payer: Self-pay | Admitting: Podiatry

## 2014-12-24 ENCOUNTER — Ambulatory Visit (INDEPENDENT_AMBULATORY_CARE_PROVIDER_SITE_OTHER): Payer: 59 | Admitting: Podiatry

## 2014-12-24 ENCOUNTER — Ambulatory Visit: Payer: Self-pay

## 2014-12-24 DIAGNOSIS — B351 Tinea unguium: Secondary | ICD-10-CM | POA: Diagnosis not present

## 2014-12-24 DIAGNOSIS — R52 Pain, unspecified: Secondary | ICD-10-CM

## 2014-12-24 DIAGNOSIS — M779 Enthesopathy, unspecified: Secondary | ICD-10-CM

## 2014-12-24 NOTE — Progress Notes (Signed)
   Subjective:    Patient ID: Patty Hale, female    DOB: [birth year], 44 y.o.   MRN: [id number]  HPI  PT STATED B/L UNDER THE ARCH ARE PAINFUL AND HAVE BURNING SENSATION. FEET ARE GETTING WORSE, AND GET AGGRAVATED BY WALKING/SITTING. TRIED NO TREATMENT.  Review of Systems  HENT: Positive for sinus pressure and sneezing.   Neurological: Positive for headaches.  All other systems reviewed and are negative.      Objective:   Physical Exam        Assessment & Plan:

## 2014-12-25 NOTE — Progress Notes (Signed)
Subjective:     Patient ID: Patty Hale, female   DOB: 09-02-1971, 44 y.o.   MRN: [id number]  HPI patient presents stating I'm getting a lot of pain in my arch of both feet when I walk and they burn at times and become irritating especially at nighttime. States it's been going on for a long time and also complains about big toenails being yellowed at the ends of both big toes   Review of Systems  All other systems reviewed and are negative.      Objective:   Physical Exam  Constitutional: She is oriented to person, place, and time.  Cardiovascular: Intact distal pulses.   Musculoskeletal: Normal range of motion.  Neurological: She is oriented to person, place, and time.  Skin: Skin is warm.  Nursing note and vitals reviewed.  neurovascular status intact with muscle strength adequate and range of motion subtalar midtarsal joint within normal limits. Patient's noted to have moderate depression of the arch bilateral with formation and discomfort around the mid arch area with fluid buildup when palpated. Patient has good digital perfusion is well oriented 3 and I also noted on the distal aspects of the hallux of both feet there is some yellow discoloration     Assessment:     Tendinitis bilateral secondary to foot structure along with mild distal mycotic nail infection hallux bilateral    Plan:     H&P and conditions and x-rays reviewed with patient. I've recommended long-term orthotics to reduce the stress on the feet and at this time I went ahead and I scanned for orthotics and also dispensed formula 3 for the fungal infection on the toenails

## 2015-01-07 ENCOUNTER — Other Ambulatory Visit: Payer: Self-pay | Admitting: General Surgery

## 2015-01-07 DIAGNOSIS — N6022 Fibroadenosis of left breast: Secondary | ICD-10-CM

## 2015-01-08 ENCOUNTER — Other Ambulatory Visit: Payer: Self-pay | Admitting: General Surgery

## 2015-01-08 DIAGNOSIS — N6022 Fibroadenosis of left breast: Secondary | ICD-10-CM

## 2015-01-09 ENCOUNTER — Ambulatory Visit: Payer: 59 | Admitting: *Deleted

## 2015-01-09 ENCOUNTER — Other Ambulatory Visit: Payer: Self-pay | Admitting: General Surgery

## 2015-01-09 DIAGNOSIS — M779 Enthesopathy, unspecified: Secondary | ICD-10-CM

## 2015-01-09 DIAGNOSIS — N6022 Fibroadenosis of left breast: Secondary | ICD-10-CM

## 2015-01-09 NOTE — Progress Notes (Signed)
Patient ID: Patty Hale, female   DOB: 01-01-1971, 44 y.o.   MRN: [id number] PICKING UP INSERTS

## 2015-01-09 NOTE — Patient Instructions (Signed)

## 2015-01-18 DIAGNOSIS — N649 Disorder of breast, unspecified: Secondary | ICD-10-CM

## 2015-01-18 HISTORY — DX: Disorder of breast, unspecified: N64.9

## 2015-01-24 ENCOUNTER — Encounter (HOSPITAL_BASED_OUTPATIENT_CLINIC_OR_DEPARTMENT_OTHER): Payer: Self-pay | Admitting: *Deleted

## 2015-01-30 ENCOUNTER — Ambulatory Visit
Admission: RE | Admit: 2015-01-30 | Discharge: 2015-01-30 | Disposition: A | Discharge status: Home / Self Care | Payer: 59 | Source: Ambulatory Visit | Attending: General Surgery | Admitting: General Surgery

## 2015-01-30 DIAGNOSIS — N6022 Fibroadenosis of left breast: Secondary | ICD-10-CM

## 2015-01-31 ENCOUNTER — Ambulatory Visit (HOSPITAL_BASED_OUTPATIENT_CLINIC_OR_DEPARTMENT_OTHER): Payer: 59 | Admitting: Anesthesiology

## 2015-01-31 ENCOUNTER — Encounter (HOSPITAL_BASED_OUTPATIENT_CLINIC_OR_DEPARTMENT_OTHER): Payer: Self-pay | Admitting: Anesthesiology

## 2015-01-31 ENCOUNTER — Ambulatory Visit
Admission: RE | Admit: 2015-01-31 | Discharge: 2015-01-31 | Disposition: A | Discharge status: Home / Self Care | Payer: 59 | Source: Ambulatory Visit | Attending: General Surgery | Admitting: General Surgery

## 2015-01-31 ENCOUNTER — Encounter (HOSPITAL_BASED_OUTPATIENT_CLINIC_OR_DEPARTMENT_OTHER)
Admission: RE | Disposition: A | Discharge status: Home / Self Care | Payer: Self-pay | Source: Ambulatory Visit | Attending: General Surgery

## 2015-01-31 ENCOUNTER — Ambulatory Visit (HOSPITAL_BASED_OUTPATIENT_CLINIC_OR_DEPARTMENT_OTHER)
Admission: RE | Admit: 2015-01-31 | Discharge: 2015-01-31 | Disposition: A | Discharge status: Home / Self Care | Payer: 59 | Source: Ambulatory Visit | Attending: General Surgery | Admitting: General Surgery

## 2015-01-31 DIAGNOSIS — Z975 Presence of (intrauterine) contraceptive device: Secondary | ICD-10-CM | POA: Insufficient documentation

## 2015-01-31 DIAGNOSIS — N6012 Diffuse cystic mastopathy of left breast: Secondary | ICD-10-CM | POA: Insufficient documentation

## 2015-01-31 DIAGNOSIS — L905 Scar conditions and fibrosis of skin: Secondary | ICD-10-CM | POA: Diagnosis not present

## 2015-01-31 DIAGNOSIS — N6092 Unspecified benign mammary dysplasia of left breast: Secondary | ICD-10-CM | POA: Diagnosis not present

## 2015-01-31 DIAGNOSIS — N63 Unspecified lump in breast: Secondary | ICD-10-CM | POA: Diagnosis present

## 2015-01-31 DIAGNOSIS — N6022 Fibroadenosis of left breast: Secondary | ICD-10-CM

## 2015-01-31 HISTORY — PX: BREAST LUMPECTOMY WITH RADIOACTIVE SEED LOCALIZATION: SHX6424

## 2015-01-31 HISTORY — DX: Dental restoration status: Z98.811

## 2015-01-31 HISTORY — DX: Disorder of breast, unspecified: N64.9

## 2015-01-31 LAB — POCT HEMOGLOBIN-HEMACUE: Hemoglobin: 11.7 g/dL — ABNORMAL LOW (ref 12.0–15.0)

## 2015-01-31 SURGERY — BREAST LUMPECTOMY WITH RADIOACTIVE SEED LOCALIZATION
Anesthesia: General | Site: Breast | Laterality: Left

## 2015-01-31 MED ORDER — CEFAZOLIN SODIUM-DEXTROSE 2-3 GM-% IV SOLR
2.0000 g | INTRAVENOUS | Status: AC
  Administered 2015-01-31: 2 g via INTRAVENOUS

## 2015-01-31 MED ORDER — SCOPOLAMINE 1 MG/3DAYS TD PT72
1.0000 | MEDICATED_PATCH | TRANSDERMAL | Status: DC
  Administered 2015-01-31: 1.5 mg via TRANSDERMAL

## 2015-01-31 MED ORDER — MIDAZOLAM HCL 2 MG/2ML IJ SOLN
INTRAMUSCULAR | Status: AC
  Filled 2015-01-31: qty 2

## 2015-01-31 MED ORDER — PROMETHAZINE HCL 25 MG/ML IJ SOLN: 6.2500 mg | INTRAMUSCULAR | Status: DC | PRN

## 2015-01-31 MED ORDER — LIDOCAINE HCL (CARDIAC) 20 MG/ML IV SOLN
INTRAVENOUS | Status: DC | PRN
  Administered 2015-01-31: 80 mg via INTRAVENOUS

## 2015-01-31 MED ORDER — DEXAMETHASONE SODIUM PHOSPHATE 4 MG/ML IJ SOLN
INTRAMUSCULAR | Status: DC | PRN
  Administered 2015-01-31: 10 mg via INTRAVENOUS

## 2015-01-31 MED ORDER — HYDROMORPHONE HCL 1 MG/ML IJ SOLN: 0.2500 mg | INTRAMUSCULAR | Status: DC | PRN

## 2015-01-31 MED ORDER — LACTATED RINGERS IV SOLN
INTRAVENOUS | Status: DC
  Administered 2015-01-31: 10:00:00 via INTRAVENOUS

## 2015-01-31 MED ORDER — FENTANYL CITRATE 0.05 MG/ML IJ SOLN
INTRAMUSCULAR | Status: AC
  Filled 2015-01-31: qty 4

## 2015-01-31 MED ORDER — CHLORHEXIDINE GLUCONATE 4 % EX LIQD: 1.0000 "application " | Freq: Once | CUTANEOUS | Status: DC

## 2015-01-31 MED ORDER — CEFAZOLIN SODIUM-DEXTROSE 2-3 GM-% IV SOLR
INTRAVENOUS | Status: AC
  Filled 2015-01-31: qty 50

## 2015-01-31 MED ORDER — OXYCODONE-ACETAMINOPHEN 5-325 MG PO TABS: 1.0000 | ORAL_TABLET | ORAL | Status: DC | PRN

## 2015-01-31 MED ORDER — OXYCODONE HCL 5 MG/5ML PO SOLN: 5.0000 mg | Freq: Once | ORAL | Status: DC | PRN

## 2015-01-31 MED ORDER — BUPIVACAINE HCL (PF) 0.25 % IJ SOLN
INTRAMUSCULAR | Status: DC | PRN
  Administered 2015-01-31: 10 mL

## 2015-01-31 MED ORDER — MIDAZOLAM HCL 2 MG/ML PO SYRP: 12.0000 mg | ORAL_SOLUTION | Freq: Once | ORAL | Status: DC | PRN

## 2015-01-31 MED ORDER — MIDAZOLAM HCL 2 MG/2ML IJ SOLN
1.0000 mg | INTRAMUSCULAR | Status: DC | PRN
  Administered 2015-01-31: 2 mg via INTRAVENOUS

## 2015-01-31 MED ORDER — ONDANSETRON HCL 4 MG/2ML IJ SOLN
INTRAMUSCULAR | Status: DC | PRN
  Administered 2015-01-31: 4 mg via INTRAVENOUS

## 2015-01-31 MED ORDER — OXYCODONE HCL 5 MG PO TABS: 5.0000 mg | ORAL_TABLET | Freq: Once | ORAL | Status: DC | PRN

## 2015-01-31 MED ORDER — PROPOFOL 10 MG/ML IV BOLUS
INTRAVENOUS | Status: DC | PRN
  Administered 2015-01-31: 100 mg via INTRAVENOUS
  Administered 2015-01-31: 200 mg via INTRAVENOUS

## 2015-01-31 MED ORDER — FENTANYL CITRATE 0.05 MG/ML IJ SOLN
50.0000 ug | INTRAMUSCULAR | Status: DC | PRN
  Administered 2015-01-31: 100 ug via INTRAVENOUS
  Administered 2015-01-31 (×2): 25 ug via INTRAVENOUS

## 2015-01-31 SURGICAL SUPPLY — 41 items
APPLIER CLIP 9.375 MED OPEN (MISCELLANEOUS) ×2
APR CLP MED 9.3 20 MLT OPN (MISCELLANEOUS) ×1
BLADE SURG 15 STRL LF DISP TIS (BLADE) ×1 IMPLANT
BLADE SURG 15 STRL SS (BLADE) ×2
CANISTER SUC SOCK COL 7IN (MISCELLANEOUS) ×2 IMPLANT
CANISTER SUCT 1200ML W/VALVE (MISCELLANEOUS) ×2 IMPLANT
CHLORAPREP W/TINT 26ML (MISCELLANEOUS) ×2 IMPLANT
CLIP APPLIE 9.375 MED OPEN (MISCELLANEOUS) ×1 IMPLANT
COVER BACK TABLE 60X90IN (DRAPES) ×2 IMPLANT
COVER MAYO STAND STRL (DRAPES) ×2 IMPLANT
COVER PROBE W GEL 5X96 (DRAPES) ×2 IMPLANT
DECANTER SPIKE VIAL GLASS SM (MISCELLANEOUS) IMPLANT
DEVICE DUBIN W/COMP PLATE 8390 (MISCELLANEOUS) ×2 IMPLANT
DRAPE LAPAROSCOPIC ABDOMINAL (DRAPES) ×1 IMPLANT
DRAPE UTILITY XL STRL (DRAPES) ×2 IMPLANT
ELECT COATED BLADE 2.86 ST (ELECTRODE) ×2 IMPLANT
ELECT REM PT RETURN 9FT ADLT (ELECTROSURGICAL) ×2
ELECTRODE REM PT RTRN 9FT ADLT (ELECTROSURGICAL) ×1 IMPLANT
GLOVE BIO SURGEON STRL SZ7.5 (GLOVE) ×4 IMPLANT
GLOVE BIOGEL PI IND STRL 7.5 (GLOVE) IMPLANT
GLOVE BIOGEL PI INDICATOR 7.5 (GLOVE) ×1
GLOVE SURG SS PI 7.5 STRL IVOR (GLOVE) ×1 IMPLANT
GOWN STRL REUS W/ TWL LRG LVL3 (GOWN DISPOSABLE) ×2 IMPLANT
GOWN STRL REUS W/TWL LRG LVL3 (GOWN DISPOSABLE) ×4
KIT MARKER MARGIN INK (KITS) ×2 IMPLANT
LIQUID BAND (GAUZE/BANDAGES/DRESSINGS) ×2 IMPLANT
NDL HYPO 25X1 1.5 SAFETY (NEEDLE) IMPLANT
NEEDLE HYPO 25X1 1.5 SAFETY (NEEDLE) ×2 IMPLANT
NS IRRIG 1000ML POUR BTL (IV SOLUTION) ×1 IMPLANT
PACK BASIN DAY SURGERY FS (CUSTOM PROCEDURE TRAY) ×2 IMPLANT
PENCIL BUTTON HOLSTER BLD 10FT (ELECTRODE) ×2 IMPLANT
SLEEVE SCD COMPRESS KNEE MED (MISCELLANEOUS) ×2 IMPLANT
SPONGE LAP 18X18 X RAY DECT (DISPOSABLE) ×2 IMPLANT
SUT MON AB 4-0 PC3 18 (SUTURE) ×1 IMPLANT
SUT SILK 2 0 SH (SUTURE) ×1 IMPLANT
SUT VICRYL 3-0 CR8 SH (SUTURE) ×2 IMPLANT
SYR CONTROL 10ML LL (SYRINGE) ×1 IMPLANT
TOWEL OR 17X24 6PK STRL BLUE (TOWEL DISPOSABLE) ×2 IMPLANT
TOWEL OR NON WOVEN STRL DISP B (DISPOSABLE) ×2 IMPLANT
TUBE CONNECTING 20X1/4 (TUBING) ×2 IMPLANT
YANKAUER SUCT BULB TIP NO VENT (SUCTIONS) ×1 IMPLANT

## 2015-01-31 NOTE — Discharge Instructions (Signed)
° °  Post Anesthesia Home Care Instructions  Activity: Get plenty of rest for the remainder of the day. A responsible adult should stay with you for 24 hours following the procedure.  For the next 24 hours, DO NOT: -Drive a car -Advertising copywriter -Drink alcoholic beverages -Take any medication unless instructed by your physician -Make any legal decisions or sign important papers.  Meals: Start with liquid foods such as gelatin or soup. Progress to regular foods as tolerated. Avoid greasy, spicy, heavy foods. If nausea and/or vomiting occur, drink only clear liquids until the nausea and/or vomiting subsides. Call your physician if vomiting continues.  Special Instructions/Symptoms: Your throat may feel dry or sore from the anesthesia or the breathing tube placed in your throat during surgery. If this causes discomfort, gargle with warm salt water. The discomfort should disappear within 24 hours.  If you had a scopolamine patch placed behind your ear for the management of post- operative nausea and/or vomiting:  1. The medication in the patch is effective for 72 hours, after which it should be removed.  Wrap patch in a tissue and discard in the trash. Wash hands thoroughly with soap and water. 2. You may remove the patch earlier than 72 hours if you experience unpleasant side effects which may include dry mouth, dizziness or visual disturbances. 3. Avoid touching the patch. Wash your hands with soap and water after contact with the patch.   Special Instructions/Symptoms: Your throat may feel dry or sore from the anesthesia or the breathing tube placed in your throat during surgery. If this causes discomfort, gargle with warm salt water. The discomfort should disappear within 24 hours.  If you had a scopolamine patch placed behind your ear for the management of post- operative nausea and/or vomiting:  1. The medication in the patch is effective for 72 hours, after which it should be removed.   Wrap patch in a tissue and discard in the trash. Wash hands thoroughly with soap and water. 2. You may remove the patch earlier than 72 hours if you experience unpleasant side effects which may include dry mouth, dizziness or visual disturbances. 3. Avoid touching the patch. Wash your hands with soap and water after contact with the patch.

## 2015-01-31 NOTE — Anesthesia Postprocedure Evaluation (Signed)
Anesthesia Post Note  Patient: Patty Hale  Procedure(s) Performed: Procedure(s) (LRB): BREAST LUMPECTOMY WITH RADIOACTIVE SEED LOCALIZATION (Left)  Anesthesia type: general  Patient location: PACU  Post pain: Pain level controlled  Post assessment: Patient's Cardiovascular Status Stable  Last Vitals:  Filed Vitals:   01/31/15 1145  BP: 122/66  Pulse: 85  Temp:   Resp: 21    Post vital signs: Reviewed and stable  Level of consciousness: sedated  Complications: No apparent anesthesia complications

## 2015-01-31 NOTE — Op Note (Signed)
01/31/2015  11:11 AM  PATIENT:  Patty Hale  44 y.o. female  PRE-OPERATIVE DIAGNOSIS:  Sclerosing Lesion Left Breast  POST-OPERATIVE DIAGNOSIS: Sclerosing Lesion Left Breast  PROCEDURE:  Procedure(s): BREAST LUMPECTOMY WITH RADIOACTIVE SEED LOCALIZATION (Left)  SURGEON:  Surgeon(s) and Role:    * Griselda Miner, MD - Primary  PHYSICIAN ASSISTANT:   ASSISTANTS: none   ANESTHESIA:   general  EBL:  Total I/O In: 800 [I.V.:800] Out: -   BLOOD ADMINISTERED:none  DRAINS: none   LOCAL MEDICATIONS USED:  MARCAINE     SPECIMEN:  Source of Specimen:  left breast tissue  DISPOSITION OF SPECIMEN:  PATHOLOGY  COUNTS:  YES  TOURNIQUET:  * No tourniquets in log *  DICTATION: .Dragon Dictation  After informed consent was obtained the patient was brought to the operating room and placed in the supine position on the operating room table. After adequate induction of general anesthesia the patient's left breast was prepped with ChloraPrep, allowed to dry, and draped in usual sterile manner. Previously an I-125 seed was placed in the upper inner quadrant of the left breast to mark an area of a sclerosing lesion. The neoprobe set on I-125 was used to identify the area of radioactivity in the upper inner quadrant of the left breast. An incision was then made with a 15 blade knife at the edge of the areola in the upper inner quadrant. The incision was carried through the skin and subcutaneous tissue sharply with electrocautery. We were able to dissect through the breast tissue with the electrocautery using the neoprobe to guide [place] to the area of the seed. Once we were close enough to the seed we then excised a circular portion of breast tissue around the area of radioactivity. This was done sharply with electrocautery. Once the specimen was removed the seed was confirmed in the specimen with the neoprobe. There was no residual radioactivity in the breast. The specimen was marked with a short single  stitch on the superior surface, a long single stitch on the lateral surface, and a long double stitch on the anterior surface which also re-created a dissection plane. A specimen radiograph was then obtained that showed the clip and the seed to be in the center of the specimen. The specimen was then sent to pathology for further evaluation. Hemostasis was achieved using the Bovie electrocautery. The wound was infiltrated with quarter percent Marcaine and irrigated with saline. The deep layer of the wound was closed with interrupted 3-0 Vicryl stitches. The skin was then closed with interrupted 4-0 Monocryl subcuticular stitches. Dermabond dressings were applied. The patient tolerated the procedure well. At the end of the case all needle sponge and instrument counts were correct. The patient was then awakened and taken to recovery in stable condition.  PLAN OF CARE: Discharge to home after PACU  PATIENT DISPOSITION:  PACU - hemodynamically stable.   Delay start of Pharmacological VTE agent (>24hrs) due to surgical blood loss or risk of bleeding: not applicable

## 2015-01-31 NOTE — Interval H&P Note (Signed)
History and Physical Interval Note:  01/31/2015 10:03 AM  Patty Hale  has presented today for surgery, with the diagnosis of Lesion Left Breast  The various methods of treatment have been discussed with the patient and family. After consideration of risks, benefits and other options for treatment, the patient has consented to  Procedure(s): BREAST LUMPECTOMY WITH RADIOACTIVE SEED LOCALIZATION (Left) as a surgical intervention .  The patient's history has been reviewed, patient examined, no change in status, stable for surgery.  I have reviewed the patient's chart and labs.  Questions were answered to the patient's satisfaction.     TOTH III,PAUL S

## 2015-01-31 NOTE — Anesthesia Preprocedure Evaluation (Addendum)
Anesthesia Evaluation  Patient identified by MRN, date of birth, ID band Patient awake    Reviewed: Allergy & Precautions, NPO status , Patient's Chart, lab work & pertinent test results  History of Anesthesia Complications Negative for: history of anesthetic complications  Airway Mallampati: II  TM Distance: >3 FB Neck ROM: Full    Dental  (+) Teeth Intact, Dental Advisory Given   Pulmonary neg pulmonary ROS,    Pulmonary exam normal       Cardiovascular negative cardio ROS      Neuro/Psych negative neurological ROS  negative psych ROS   GI/Hepatic negative GI ROS, Neg liver ROS,   Endo/Other  negative endocrine ROS  Renal/GU negative Renal ROS     Musculoskeletal   Abdominal   Peds  Hematology   Anesthesia Other Findings   Reproductive/Obstetrics                             Anesthesia Physical Anesthesia Plan  ASA: II  Anesthesia Plan: General   Post-op Pain Management:    Induction: Intravenous  Airway Management Planned: LMA  Additional Equipment:   Intra-op Plan:   Post-operative Plan: Extubation in OR  Informed Consent: I have reviewed the patients History and Physical, chart, labs and discussed the procedure including the risks, benefits and alternatives for the proposed anesthesia with the patient or authorized representative who has indicated his/her understanding and acceptance.   Dental advisory given  Plan Discussed with: CRNA, Anesthesiologist and Surgeon  Anesthesia Plan Comments:         Anesthesia Quick Evaluation

## 2015-01-31 NOTE — Anesthesia Procedure Notes (Signed)
Procedure Name: LMA Insertion Date/Time: 01/31/2015 10:23 AM Performed by: Burna Cash Pre-anesthesia Checklist: Patient identified, Emergency Drugs available, Suction available and Patient being monitored Patient Re-evaluated:Patient Re-evaluated prior to inductionOxygen Delivery Method: Circle System Utilized Preoxygenation: Pre-oxygenation with 100% oxygen Intubation Type: IV induction Ventilation: Mask ventilation without difficulty LMA: LMA inserted LMA Size: 4.0 Number of attempts: 1 Airway Equipment and Method: Bite block Placement Confirmation: positive ETCO2 Tube secured with: Tape Dental Injury: Teeth and Oropharynx as per pre-operative assessment

## 2015-01-31 NOTE — Transfer of Care (Signed)
Immediate Anesthesia Transfer of Care Note  Patient: Patty [facility]  Procedure(s) Performed: Procedure(s): BREAST LUMPECTOMY WITH RADIOACTIVE SEED LOCALIZATION (Left)  Patient Location: PACU  Anesthesia Type:General  Level of Consciousness: sedated  Airway & Oxygen Therapy: Patient Spontanous Breathing and Patient connected to face mask oxygen  Post-op Assessment: Report given to RN and Post -op Vital signs reviewed and stable  Post vital signs: Reviewed and stable  Last Vitals:  Filed Vitals:   01/31/15 0930  BP: 124/55  Pulse: 78  Temp: 36.9 C  Resp: 16    Complications: No apparent anesthesia complications

## 2015-01-31 NOTE — H&P (Signed)
Patty [facility] 12/18/2014 11:13 AM Location: [facility] Patient #: [id number] DOB: 01-Aug-1971 Married / Language: English / Race: Black or African American Female  History of Present Illness [facility]. [name] MD; 12/18/2014 12:14 PM) Patient words: left breast eval.  The patient is a 44 year old female who presents with a breast mass. We are asked to see the patient in consultation by Dr. [name] to evaluate her for a left breast mass. The patient is a 44 year old black female who recently went for a routine screening mammogram. At that time she was found to have an abnormality in the upper inner left breast. This was biopsied and came back as a sclerosing lesion. She denies any breast pain. She denies any discharge from the nipple. She has no personal or family history of breast cancer. She does have some exposure to female hormones from an IUD.   Other Problems [name], CMA; 12/18/2014 11:13 AM) No pertinent past medical history  Past Surgical History [name], CMA; 12/18/2014 11:13 AM) Breast Biopsy Left. Shoulder Surgery Right.  Diagnostic Studies History [name], CMA; 12/18/2014 11:13 AM) Colonoscopy never Mammogram within last year Pap Smear 1-5 years ago  Allergies [name], CMA; 12/18/2014 11:14 AM) No Known Drug Allergies03/10/2014  Medication History ([name], CMA; 12/18/2014 11:14 AM) No Current Medications Medications Reconciled  Social History [name], CMA; 12/18/2014 11:13 AM) Caffeine use Coffee, Tea. No alcohol use No drug use Tobacco use Never smoker.  Family History [name], CMA; 12/18/2014 11:13 AM) Heart disease in female family member before age 76 Hypertension Mother.  Pregnancy / Birth History [name], CMA; 12/18/2014 11:13 AM) Age at menarche 10 years. Contraceptive History Intrauterine device. Gravida 2 Irregular periods Maternal age 35-25 Para 2  Review of Systems [name] CMA;  12/18/2014 11:13 AM) General Not Present- Appetite Loss, Chills, Fatigue, Fever, Night Sweats, Weight Gain and Weight Loss. Skin Present- Dryness. Not Present- Change in Wart/Mole, Hives, Jaundice, New Lesions, Non-Healing Wounds, Rash and Ulcer. HEENT Not Present- Earache, Hearing Loss, Hoarseness, Nose Bleed, Oral Ulcers, Ringing in the Ears, Seasonal Allergies, Sinus Pain, Sore Throat, Visual Disturbances, Wears glasses/contact lenses and Yellow Eyes. Respiratory Not Present- Bloody sputum, Chronic Cough, Difficulty Breathing, Snoring and Wheezing. Breast Not Present- Breast Mass, Breast Pain, Nipple Discharge and Skin Changes. Cardiovascular Not Present- Chest Pain, Difficulty Breathing Lying Down, Leg Cramps, Palpitations, Rapid Heart Rate, Shortness of Breath and Swelling of Extremities. Gastrointestinal Not Present- Abdominal Pain, Bloating, Bloody Stool, Change in Bowel Habits, Chronic diarrhea, Constipation, Difficulty Swallowing, Excessive gas, Gets full quickly at meals, Hemorrhoids, Indigestion, Nausea, Rectal Pain and Vomiting. Female Genitourinary Not Present- Frequency, Nocturia, Painful Urination, Pelvic Pain and Urgency. Musculoskeletal Not Present- Back Pain, Joint Pain, Joint Stiffness, Muscle Pain, Muscle Weakness and Swelling of Extremities. Neurological Not Present- Decreased Memory, Fainting, Headaches, Numbness, Seizures, Tingling, Tremor, Trouble walking and Weakness. Psychiatric Not Present- Anxiety, Bipolar, Change in Sleep Pattern, Depression, Fearful and Frequent crying. Endocrine Not Present- Cold Intolerance, Excessive Hunger, Hair Changes, Heat Intolerance, Hot flashes and New Diabetes. Hematology Not Present- Easy Bruising, Excessive bleeding, Gland problems, HIV and Persistent Infections.   Vitals ([name] CMA; 12/18/2014 11:14 AM) 12/18/2014 11:14 AM Weight: 187 lb Height: 67in Body Surface Area: 2 m Body Mass Index: 29.29 kg/m Temp.: 97.51F(Temporal)   Pulse: 76 (Regular)  BP: 120/80 (Sitting, Left Arm, Standard)    Physical Exam [name] S. [name] MD; 12/18/2014 12:15 PM) General Mental Status-Alert. General Appearance-Consistent with stated age.  Hydration-Well hydrated. Voice-Normal.  Head and Neck Head-normocephalic, atraumatic with no lesions or palpable masses. Trachea-midline. Thyroid Gland Characteristics - normal size and consistency.  Eye Eyeball - Bilateral-Extraocular movements intact. Sclera/Conjunctiva - Bilateral-No scleral icterus.  Chest and Lung Exam Chest and lung exam reveals -quiet, even and easy respiratory effort with no use of accessory muscles and on auscultation, normal breath sounds, no adventitious sounds and normal vocal resonance. Inspection Chest Wall - Normal. Back - normal.  Breast Note: There is no palpable mass in either breast. There is no palpable axillary, supraclavicular, or cervical lymphadenopathy.   Cardiovascular Cardiovascular examination reveals -normal heart sounds, regular rate and rhythm with no murmurs and normal pedal pulses bilaterally.  Abdomen Inspection Inspection of the abdomen reveals - No Hernias. Skin - Scar - no surgical scars. Palpation/Percussion Palpation and Percussion of the abdomen reveal - Soft, Non Tender, No Rebound tenderness, No Rigidity (guarding) and No hepatosplenomegaly. Auscultation Auscultation of the abdomen reveals - Bowel sounds normal.  Neurologic Neurologic evaluation reveals -alert and oriented x 3 with no impairment of recent or remote memory. Mental Status-Normal.  Musculoskeletal Normal Exam - Left-Upper Extremity Strength Normal and Lower Extremity Strength Normal. Normal Exam - Right-Upper Extremity Strength Normal and Lower Extremity Strength Normal.  Lymphatic Head & Neck  General Head & Neck Lymphatics: Bilateral - Description - Normal. Axillary  General Axillary Region: Bilateral - Description  - Normal. Tenderness - Non Tender. Femoral & Inguinal  Generalized Femoral & Inguinal Lymphatics: Bilateral - Description - Normal. Tenderness - Non Tender.    Assessment & Plan [name] S. [name] MD; 12/18/2014 11:39 AM) SCLEROSING ADENOSIS OF BREAST, LEFT (610.2  N60.22) Impression: The patient appears to have a complex sclerosing lesion in the upper inner left breast. Because this lesion is considered a high risk lesion and increases the risk for breast cancer and because of its abnormal appearance on the mammogram the recommendation would be for this area to be removed. I have discussed with her in detail the risks and benefits of the operation to remove this lesion as well as some of the technical aspects and she understands and wishes to proceed. We will plan for a left breast radioactive seed localized lumpectomy     Signed by Caleen Essex, MD (12/18/2014 12:15 PM)

## 2015-02-01 ENCOUNTER — Encounter (HOSPITAL_BASED_OUTPATIENT_CLINIC_OR_DEPARTMENT_OTHER): Payer: Self-pay | Admitting: General Surgery

## 2015-02-27 ENCOUNTER — Telehealth: Payer: Self-pay | Admitting: *Deleted

## 2015-02-27 NOTE — Telephone Encounter (Signed)
Received referral from [facility].  Called pt and she requested a after 4 appt.  Informed her that Dr. Darnelle Catalan could see her at 4 for check in and 4:30 for him.  She was fine with that.  Confirmed 03/21/15 appt w/ pt.  Mailed calendar, welcoming packet U intake form to pt.  Emailed Scientist, research (medical) at [facility] to make her aware. All records are in [facility].

## 2015-03-21 ENCOUNTER — Ambulatory Visit: Payer: 59

## 2015-03-21 ENCOUNTER — Telehealth: Payer: Self-pay | Admitting: Oncology

## 2015-03-21 ENCOUNTER — Ambulatory Visit: Payer: 59 | Admitting: Oncology

## 2015-03-21 DIAGNOSIS — N6489 Other specified disorders of breast: Secondary | ICD-10-CM | POA: Insufficient documentation

## 2015-03-21 DIAGNOSIS — R922 Inconclusive mammogram: Secondary | ICD-10-CM | POA: Insufficient documentation

## 2015-03-21 NOTE — Telephone Encounter (Signed)
Patient left message on vm that see would not be able to make her appointment today and needed to reschedule. Left message for Baltazar Apo re call. New high risk.

## 2015-03-21 NOTE — Progress Notes (Signed)
Patty [facility]  [phone] [phone]   PATIENT DID NOT SHOW FOR APPOINTMENT  ID: Patty Hale DOB: [birth year]  MR#: [id number]  UUV#:[id number]  Patient Care Team: Patty Dills, MD as PCP - General PCP: Patty Apo, MD GYN: Patty Minium MD SU: Patty Pellegrini MD OTHER MD:  CHIEF COMPLAINT: High risk for breast cancer  CURRENT TREATMENT: Observation   BREAST CANCER HISTORY: Patty Hale had routine screening mammography 11/12/2014 suggesting a change in her left breast. On 11/21/2014 she underwent bilateral diagnostic mammography with left breast ultrasonography at the breast [facility]. The breast density was category C. Compression views confirmed a subtle area of distortion in the upper inner quadrant of the left breast which was not palpable on exam. By ultrasound there was an irregularly marginated hypoechoic mass vertically oriented in this area, measuring 7 mm. Ultrasound of the left axilla was normal.  Biopsy of the left breast mass in question to 09/08/2015 showed ([id number]) a sclerosing lesion consistent with radial scar. There was no atypia or malignancy. The patient proceeded to left lumpectomy without sentinel lymph node sampling 01/31/2015. This showed ([id number]) a radial scar with no evidence of malignancy.  The patient's subsequent history is as detailed below.     INTERVAL HISTORY:  REVIEW OF SYSTEMS:  PAST MEDICAL HISTORY: Past Medical History  Diagnosis Date  . Lesion of breast 01/2015    left  . Dental crowns present     PAST SURGICAL HISTORY: Past Surgical History  Procedure Laterality Date  . Intrauterine device insertion  07/17/2008    MIRENA  . Knee arthroscopy Right 1991  . Shoulder arthroscopy with rotator cuff repair and subacromial decompression Right 02/16/2014    Procedure: RIGHT SHOULDER ARTHROSCOPY WITH DEBRIDEMENT EXTENSIVE/DISTAL CLAVICULECTOMY; SUBACROMIAL DECOMPRESSION;  PARTIAL ACROMIOPLASTY AND OPEN  ROTATOR CUFF REPAIR;  Surgeon: Patty Flake., MD;  Location: [facility];  Service: Orthopedics;  Laterality: Right;  ANESTHESIA: GENERAL, PRE/POST OP SCALENE  . Breast lumpectomy with radioactive seed localization Left 01/31/2015    Procedure: BREAST LUMPECTOMY WITH RADIOACTIVE SEED LOCALIZATION;  Surgeon: Patty Pretty III, MD;  Location: [facility];  Service: General;  Laterality: Left;    FAMILY HISTORY Family History  Problem Relation Age of Onset  . Hyperlipidemia Patty Hale   . Hypertension Patty Hale   . Hyperlipidemia Patty Hale   . Hypertension Patty Hale   . Heart disease Patty Hale     GYNECOLOGIC HISTORY:  No LMP recorded. Patient is not currently having periods (Reason: IUD).  SOCIAL HISTORY:      ADVANCED DIRECTIVES:    HEALTH MAINTENANCE: History  Substance Use Topics  . Smoking status: Never Smoker   . Smokeless tobacco: Never Used  . Alcohol Use: No     Colonoscopy:  PAP:  Bone density:  Lipid panel:  No Known Allergies  Current Outpatient Prescriptions  Medication Sig Dispense Refill  . levonorgestrel (MIRENA) 20 MCG/24HR IUD 1 each by Intrauterine route once.      Marland Kitchen oxyCODONE-acetaminophen (ROXICET) 5-325 MG per tablet Take 1-2 tablets by mouth every 4 (four) hours as needed. 50 tablet 0   No current facility-administered medications for this visit.    OBJECTIVE: There were no vitals filed for this visit.   There is no weight on file to calculate BMI.    ECOG FS:  Ocular: Sclerae unicteric, pupils equal, round and reactive to light Ear-nose-throat: Oropharynx clear, dentition fair Lymphatic: No cervical or supraclavicular adenopathy Lungs no  rales or rhonchi, good excursion bilaterally Heart regular rate and rhythm, no murmur appreciated Abd soft, nontender, positive bowel sounds MSK no focal spinal tenderness, no joint edema Neuro: non-focal, well-oriented, appropriate affect Breasts:    LAB  RESULTS:  CMP     Component Value Date/Time   NA 140 11/02/2014 0941   K 4.7 11/02/2014 0941   CL 106 11/02/2014 0941   CO2 25 11/02/2014 0941   GLUCOSE 80 11/02/2014 0941   BUN 11 11/02/2014 0941   CREATININE 1.11* 11/02/2014 0941   CALCIUM 9.2 11/02/2014 0941   PROT 6.6 11/02/2014 0941   ALBUMIN 3.8 11/02/2014 0941   AST 16 11/02/2014 0941   ALT 11 11/02/2014 0941   ALKPHOS 71 11/02/2014 0941   BILITOT 0.4 11/02/2014 0941    INo results found for: SPEP, UPEP  Lab Results  Component Value Date   WBC 5.7 11/02/2014   NEUTROABS 3.3 11/02/2014   HGB 11.7* 01/31/2015   HCT 39.6 11/02/2014   MCV 89.8 11/02/2014   PLT 205 11/02/2014      Chemistry      Component Value Date/Time   NA 140 11/02/2014 0941   K 4.7 11/02/2014 0941   CL 106 11/02/2014 0941   CO2 25 11/02/2014 0941   BUN 11 11/02/2014 0941   CREATININE 1.11* 11/02/2014 0941      Component Value Date/Time   CALCIUM 9.2 11/02/2014 0941   ALKPHOS 71 11/02/2014 0941   AST 16 11/02/2014 0941   ALT 11 11/02/2014 0941   BILITOT 0.4 11/02/2014 0941       No results found for: LABCA2  No components found for: [id number]  No results for input(s): INR in the last 168 hours.  Urinalysis    Component Value Date/Time   COLORURINE YELLOW 11/02/2014 0941   APPEARANCEUR CLEAR 11/02/2014 0941   LABSPEC 1.025 11/02/2014 0941   PHURINE 6.0 11/02/2014 0941   GLUCOSEU NEG 11/02/2014 0941   HGBUR SMALL* 11/02/2014 0941   BILIRUBINUR NEG 11/02/2014 0941   KETONESUR NEG 11/02/2014 0941   PROTEINUR NEG 11/02/2014 0941   UROBILINOGEN 0.2 11/02/2014 0941   NITRITE NEG 11/02/2014 0941   LEUKOCYTESUR NEG 11/02/2014 0941    STUDIES: No results found.  ASSESSMENT: 44 y.o.  Patty Hale woman status post left lumpectomy 01/31/2015 for a radial scar   (1) breast density category C)  PLAN:  PATIENT DID NOT SHOW FOR APPOINTMENT   Patty Dell, MD   03/21/2015 4:37 PM Medical Oncology and Hematology [facility] [address] [place], [place] [address] Tel. [phone]    Fax. [phone]

## 2015-03-22 ENCOUNTER — Telehealth: Payer: Self-pay | Admitting: *Deleted

## 2015-03-22 NOTE — Telephone Encounter (Signed)
Received call from Dory Peru stating that pt cannot make her appt on 6/2.  Called pt and confirmed 04/11/15 appt.  She still has paperwork and will bring at this visit.

## 2015-04-11 ENCOUNTER — Encounter: Payer: 59 | Admitting: Oncology

## 2015-04-11 ENCOUNTER — Ambulatory Visit: Payer: 59

## 2015-04-11 ENCOUNTER — Telehealth: Payer: Self-pay | Admitting: *Deleted

## 2015-04-11 NOTE — Telephone Encounter (Signed)
Received a message from HIM stating the pt cannot make her appt today and wishes to cancel and request to be seen later in the year.  Called and left a message for the pt to return my call when she is ready to schedule.

## 2015-05-27 ENCOUNTER — Encounter: Payer: Self-pay | Admitting: Podiatry

## 2015-05-27 ENCOUNTER — Ambulatory Visit (INDEPENDENT_AMBULATORY_CARE_PROVIDER_SITE_OTHER): Payer: 59 | Admitting: Podiatry

## 2015-05-27 VITALS — BP 125/56 | HR 50 | Resp 16

## 2015-05-27 DIAGNOSIS — M722 Plantar fascial fibromatosis: Secondary | ICD-10-CM | POA: Diagnosis not present

## 2015-05-27 MED ORDER — DICLOFENAC SODIUM 75 MG PO TBEC: 75.0000 mg | DELAYED_RELEASE_TABLET | Freq: Two times a day (BID) | ORAL | Status: DC

## 2015-05-29 NOTE — Progress Notes (Signed)
Subjective:     Patient ID: Patty Hale, female   DOB: November 18, 1970, 44 y.o.   MRN: [id number]  HPI patient states that she's been getting some pain again in her arches and that she needed and brace and that the pain is mild to moderate in intensity but present   Review of Systems     Objective:   Physical Exam Neurovascular status intact with continued discomfort in the mid arch area bilateral moderate in its intensity with no swelling noted    Assessment:     Plantar fasciitis mid arch area bilateral with inflammation and fluid buildup    Plan:     Reviewed orthotics and they are at this time well position for the patient with no indications that they are not aligned properly. They have gotten somewhat flattened and that may be part of the problem and today I dispensed fascial brace is for each foot to try to lift the arch is

## 2015-09-04 ENCOUNTER — Ambulatory Visit (INDEPENDENT_AMBULATORY_CARE_PROVIDER_SITE_OTHER): Payer: 59 | Admitting: Physician Assistant

## 2015-09-04 VITALS — BP 122/84 | HR 85 | Temp 98.8°F | Resp 18 | Ht 66.25 in | Wt 184.6 lb

## 2015-09-04 DIAGNOSIS — F411 Generalized anxiety disorder: Secondary | ICD-10-CM | POA: Diagnosis not present

## 2015-09-04 DIAGNOSIS — J069 Acute upper respiratory infection, unspecified: Secondary | ICD-10-CM

## 2015-09-04 MED ORDER — HYDROXYZINE HCL 25 MG PO TABS: 25.0000 mg | ORAL_TABLET | Freq: Three times a day (TID) | ORAL | Status: DC | PRN

## 2015-09-04 MED ORDER — AZITHROMYCIN 250 MG PO TABS: ORAL_TABLET | ORAL | Status: DC

## 2015-09-04 NOTE — Patient Instructions (Signed)
Generalized Anxiety Disorder Generalized anxiety disorder (GAD) is a mental disorder. It interferes with life functions, including relationships, work, and school. GAD is different from normal anxiety, which everyone experiences at some point in their lives in response to specific life events and activities. Normal anxiety actually helps us prepare for and get through these life events and activities. Normal anxiety goes away after the event or activity is over.  GAD causes anxiety that is not necessarily related to specific events or activities. It also causes excess anxiety in proportion to specific events or activities. The anxiety associated with GAD is also difficult to control. GAD can vary from mild to severe. People with severe GAD can have intense waves of anxiety with physical symptoms (panic attacks).  SYMPTOMS The anxiety and worry associated with GAD are difficult to control. This anxiety and worry are related to many life events and activities and also occur more days than not for 6 months or longer. People with GAD also have three or more of the following symptoms (one or more in children):  Restlessness.   Fatigue.  Difficulty concentrating.   Irritability.  Muscle tension.  Difficulty sleeping or unsatisfying sleep. DIAGNOSIS GAD is diagnosed through an assessment by your health care provider. Your health care provider will ask you questions aboutyour mood,physical symptoms, and events in your life. Your health care provider may ask you about your medical history and use of alcohol or drugs, including prescription medicines. Your health care provider may also do a physical exam and blood tests. Certain medical conditions and the use of certain substances can cause symptoms similar to those associated with GAD. Your health care provider may refer you to a mental health specialist for further evaluation. TREATMENT The following therapies are usually used to treat GAD:    Medication. Antidepressant medication usually is prescribed for long-term daily control. Antianxiety medicines may be added in severe cases, especially when panic attacks occur.   Talk therapy (psychotherapy). Certain types of talk therapy can be helpful in treating GAD by providing support, education, and guidance. A form of talk therapy called cognitive behavioral therapy can teach you healthy ways to think about and react to daily life events and activities.  Stress managementtechniques. These include yoga, meditation, and exercise and can be very helpful when they are practiced regularly. A mental health specialist can help determine which treatment is best for you. Some people see improvement with one therapy. However, other people require a combination of therapies.   This information is not intended to replace advice given to you by your health care provider. Make sure you discuss any questions you have with your health care provider.   Document Released: 01/30/2013 Document Revised: 10/26/2014 Document Reviewed: 01/30/2013 Elsevier Interactive Patient Education 2016 [employer]  

## 2015-09-06 NOTE — Progress Notes (Signed)
[facility] [address], [place] [address] [phone]  Date:  09/04/2015   Name:  Patty Hale   DOB:  1971/03/02   MRN:  [id number]  PCP:  Katy Apo, MD    History of Present Illness:  Patty Hale is a 44 y.o. female patient who presents to [facility] for cc of anxiety. Patient reports that she has increased anxiety with not being able to escape certain settings.  Riding in cars, elevators, feeling trapped at work.  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  She has had a sore throat.  She has had no sob, or dyspnea.  Sore throat for several days, with neck pain.  But she thought that if her throat got too swollen she would not be able to breathe, which sent her to a panic.  She has no shortness of breath, diaphoresis, ears ringing, or dizziness.  Husband is concerned for these continued worries and anxiety behavior, and advised that they were going to go to the urgent. Her pcp is Dr. Nehemiah Settle.  She states that he has issued her a benzodiazepine in the past, which she states works.  She has had SSRI, but this may not have worked for her.  No si/hi.  No auditory or visual hallucinations.   Patient Active Problem List   Diagnosis Date Noted  . Radial scar of breast 03/21/2015  . Dense breast tissue 03/21/2015  . IUD (intrauterine device) in place 10/11/2013  . Mastodynia 01/25/2013  . Hx gestational diabetes 10/05/2011    Past Medical History  Diagnosis Date  . Lesion of breast 01/2015    left  . Dental crowns present     Past Surgical History  Procedure Laterality Date  . Intrauterine device insertion  07/17/2008    MIRENA  . Knee arthroscopy Right 1991  . Shoulder arthroscopy with rotator cuff repair and subacromial decompression Right 02/16/2014    Procedure: RIGHT SHOULDER ARTHROSCOPY WITH DEBRIDEMENT EXTENSIVE/DISTAL CLAVICULECTOMY; SUBACROMIAL DECOMPRESSION;   PARTIAL ACROMIOPLASTY AND OPEN ROTATOR CUFF REPAIR;  Surgeon: Thera Flake., MD;  Location: [facility];  Service: Orthopedics;  Laterality: Right;  ANESTHESIA: GENERAL, PRE/POST OP SCALENE  . Breast lumpectomy with radioactive seed localization Left 01/31/2015    Procedure: BREAST LUMPECTOMY WITH RADIOACTIVE SEED LOCALIZATION;  Surgeon: Chevis Pretty III, MD;  Location: [facility];  Service: General;  Laterality: Left;    Social History  Substance Use Topics  . Smoking status: Never Smoker   . Smokeless tobacco: Never Used  . Alcohol Use: No    Family History  Problem Relation Age of Onset  . Hyperlipidemia Mother   . Hypertension Mother   . Hyperlipidemia Father   . Hypertension Maternal Aunt   . Heart disease Maternal Grandfather     No Known Allergies  Medication list has been reviewed and updated.  Current Outpatient Prescriptions on File Prior to Visit  Medication Sig Dispense Refill  . levonorgestrel (MIRENA) 20 MCG/24HR IUD 1 each by Intrauterine route once.      . diclofenac (VOLTAREN) 75 MG EC tablet Take 1 tablet (75 mg total) by mouth 2 (two) times daily. (Patient not taking: Reported on 09/04/2015) 50 tablet 2   No current facility-administered medications on file prior to visit.    ROS ROS otherwise unremarkable unless listed above.   Physical Examination: BP 122/84 mmHg  Pulse  85  Temp(Src) 98.8 F (37.1 C) (Oral)  Resp 18  Ht 5' 6.25" (1.683 m)  Wt 184 lb 9.6 oz (83.734 kg)  BMI 29.56 kg/m2  SpO2 95% Ideal Body Weight: Weight in (lb) to have BMI = 25: 155.7   Physical Exam  Constitutional: She is oriented to person, place, and time. She appears well-developed and well-nourished. No distress.  HENT:  Head: Normocephalic and atraumatic.  Right Ear: Tympanic membrane, external ear and ear canal normal.  Left Ear: Tympanic membrane, external ear and ear canal normal.  Nose: Mucosal edema and rhinorrhea present. Right sinus  exhibits no maxillary sinus tenderness and no frontal sinus tenderness. Left sinus exhibits no maxillary sinus tenderness and no frontal sinus tenderness.  Mouth/Throat: No uvula swelling. Posterior oropharyngeal erythema (mild) present. No oropharyngeal exudate or posterior oropharyngeal edema.  Eyes: Conjunctivae and EOM are normal. Pupils are equal, round, and reactive to light.  Cardiovascular: Normal rate and regular rhythm.  Exam reveals no gallop, no distant heart sounds and no friction rub.   No murmur heard. Pulmonary/Chest: Effort normal. No respiratory distress. She has no decreased breath sounds. She has no wheezes. She has no rhonchi.  Lymphadenopathy:       Head (right side): No submandibular, no tonsillar, no preauricular and no posterior auricular adenopathy present.       Head (left side): No submandibular, no tonsillar, no preauricular and no posterior auricular adenopathy present.  Neurological: She is alert and oriented to person, place, and time.  Skin: She is not diaphoretic.  Psychiatric: She has a normal mood and affect. Her behavior is normal. She expresses no suicidal plans and no homicidal plans.     Assessment and Plan: Patty Hale is a 44 y.o. female who is here today for throat pain and anxiety symptoms.   -at this time, I am issuing her vistaril.  No definite hx of benzodiazepine, and her pcp should issue her this if he thinks this is the best idea.  She declines an SSRI at this time.  She will accept the referral to psychologist at this time. -treating her URI with azithromycin  1. Acute upper respiratory infection - azithromycin (ZITHROMAX) 250 MG tablet; Take 2 tabs PO x 1 dose, then 1 tab PO QD x 4 days  Dispense: 6 tablet; Refill: 0  2. Anxiety state - Ambulatory referral to Psychology - hydrOXYzine (ATARAX/VISTARIL) 25 MG tablet; Take 1-2 tablets (25-50 mg total) by mouth every 8 (eight) hours as needed for anxiety.  Dispense: 30 tablet; Refill:  0   Trena Platt, PA-C [facility] 09/06/2015 5:19 AM

## 2015-11-04 ENCOUNTER — Encounter: Payer: Self-pay | Admitting: Gynecology

## 2015-11-04 ENCOUNTER — Ambulatory Visit (INDEPENDENT_AMBULATORY_CARE_PROVIDER_SITE_OTHER): Payer: 59 | Admitting: Gynecology

## 2015-11-04 ENCOUNTER — Encounter: Payer: 59 | Admitting: Gynecology

## 2015-11-04 VITALS — BP 122/80 | Ht 67.0 in | Wt 182.0 lb

## 2015-11-04 DIAGNOSIS — Z01419 Encounter for gynecological examination (general) (routine) without abnormal findings: Secondary | ICD-10-CM | POA: Diagnosis not present

## 2015-11-04 LAB — COMPREHENSIVE METABOLIC PANEL
ALT: 14 U/L (ref 6–29)
AST: 21 U/L (ref 10–35)
Albumin: 4.1 g/dL (ref 3.6–5.1)
Alkaline Phosphatase: 73 U/L (ref 33–115)
BUN: 12 mg/dL (ref 7–25)
CO2: 28 mmol/L (ref 20–31)
Calcium: 9.1 mg/dL (ref 8.6–10.2)
Chloride: 103 mmol/L (ref 98–110)
Creat: 1.25 mg/dL — ABNORMAL HIGH (ref 0.50–1.10)
Glucose, Bld: 78 mg/dL (ref 65–99)
Potassium: 3.9 mmol/L (ref 3.5–5.3)
Sodium: 139 mmol/L (ref 135–146)
Total Bilirubin: 0.4 mg/dL (ref 0.2–1.2)
Total Protein: 6.7 g/dL (ref 6.1–8.1)

## 2015-11-04 LAB — CBC WITH DIFFERENTIAL/PLATELET
Basophils Absolute: 0 10*3/uL (ref 0.0–0.1)
Basophils Relative: 0 % (ref 0–1)
Eosinophils Absolute: 0.1 10*3/uL (ref 0.0–0.7)
Eosinophils Relative: 1 % (ref 0–5)
HCT: 42.2 % (ref 36.0–46.0)
Hemoglobin: 13.4 g/dL (ref 12.0–15.0)
Lymphocytes Relative: 47 % — ABNORMAL HIGH (ref 12–46)
Lymphs Abs: 2.4 10*3/uL (ref 0.7–4.0)
MCH: 28.4 pg (ref 26.0–34.0)
MCHC: 31.8 g/dL (ref 30.0–36.0)
MCV: 89.4 fL (ref 78.0–100.0)
MPV: 10.9 fL (ref 8.6–12.4)
Monocytes Absolute: 0.3 10*3/uL (ref 0.1–1.0)
Monocytes Relative: 6 % (ref 3–12)
Neutro Abs: 2.3 10*3/uL (ref 1.7–7.7)
Neutrophils Relative %: 46 % (ref 43–77)
Platelets: 177 10*3/uL (ref 150–400)
RBC: 4.72 MIL/uL (ref 3.87–5.11)
RDW: 13.3 % (ref 11.5–15.5)
WBC: 5.1 10*3/uL (ref 4.0–10.5)

## 2015-11-04 LAB — LIPID PANEL
Cholesterol: 147 mg/dL (ref 125–200)
HDL: 59 mg/dL (ref >=46)
LDL Cholesterol: 79 mg/dL (ref <130)
Total CHOL/HDL Ratio: 2.5 Ratio (ref <=5.0)
Triglycerides: 43 mg/dL (ref <150)
VLDL: 9 mg/dL (ref <30)

## 2015-11-04 LAB — TSH: TSH: 2.349 u[IU]/mL (ref 0.350–4.500)

## 2015-11-04 NOTE — Progress Notes (Signed)
Patty Hale 1971-01-07 [id number]   History:    45 y.o.  for annual gyn exam with no complaints today. Patient had the Mirena IUD changed in 2015. Patient's having no menstrual cycles and is doing well otherwise. Her flu vaccine is up-to-date. Patient with no previous history of any abnormal Pap smear. Patient is fasting today we'll have her blood work today. Patient had a left lumpectomy last year for benign pathology which had demonstrated fibrocystic changes and scar tissue.  Past medical history,surgical history, family history and social history were all reviewed and documented in the EPIC chart.  Gynecologic History Patient's last menstrual period was 10/06/2015. Contraception: IUD Last Pap: 2016. Results were: normal Last mammogram: See above. Results were: See above  Obstetric History OB History  Gravida Para Term Preterm AB SAB TAB Ectopic Multiple Living  2 2 2       2     # Outcome Date GA Lbr Len/2nd Weight Sex Delivery Anes PTL Lv  2 Term     M Vag-Spont  N Y  1 Term     F Vag-Spont  N Y       ROS: A ROS was performed and pertinent positives and negatives are included in the history.  GENERAL: No fevers or chills. HEENT: No change in vision, no earache, sore throat or sinus congestion. NECK: No pain or stiffness. CARDIOVASCULAR: No chest pain or pressure. No palpitations. PULMONARY: No shortness of breath, cough or wheeze. GASTROINTESTINAL: No abdominal pain, nausea, vomiting or diarrhea, melena or bright red blood per rectum. GENITOURINARY: No urinary frequency, urgency, hesitancy or dysuria. MUSCULOSKELETAL: No joint or muscle pain, no back pain, no recent trauma. DERMATOLOGIC: No rash, no itching, no lesions. ENDOCRINE: No polyuria, polydipsia, no heat or cold intolerance. No recent change in weight. HEMATOLOGICAL: No anemia or easy bruising or bleeding. NEUROLOGIC: No headache, seizures, numbness, tingling or weakness. PSYCHIATRIC: No depression, no loss of interest  in normal activity or change in sleep pattern.     Exam: chaperone present  BP 122/80 mmHg  Ht 5\' 7"  (1.702 m)  Wt 182 lb (82.555 kg)  BMI 28.50 kg/m2  LMP 10/06/2015  Body mass index is 28.5 kg/(m^2).  General appearance : Well developed well nourished female. No acute distress HEENT: Eyes: no retinal hemorrhage or exudates,  Neck supple, trachea midline, no carotid bruits, no thyroidmegaly Lungs: Clear to auscultation, no rhonchi or wheezes, or rib retractions  Heart: Regular rate and rhythm, no murmurs or gallops Breast:Examined in sitting and supine position were symmetrical in appearance, no palpable masses or tenderness,  no skin retraction, no nipple inversion, no nipple discharge, no skin discoloration, no axillary or supraclavicular lymphadenopathy Abdomen: no palpable masses or tenderness, no rebound or guarding Extremities: no edema or skin discoloration or tenderness  Pelvic:  Bartholin, Urethra, Skene Glands: Within normal limits             Vagina: No gross lesions or discharge  Cervix: No gross lesions or discharge  Uterus  anteverted, normal size, shape and consistency, non-tender and mobile  Adnexa  Without masses or tenderness  Anus and perineum  normal   Rectovaginal  normal sphincter tone without palpated masses or tenderness             Hemoccult not indicated IUD string seen     Assessment/Plan:  45 y.o. female for annual exam doing well with her Mirena IUD scheduled to be removed in 2020. Patient will have the following  screening blood were: Fasting lipid profile, comprehensive metabolic panel, TSH, CBC, and urinalysis. Pap smear not indicated this year. Patient was given a requisition to schedule her three-dimensional mammogram which is due next month.   Ok Edwards MD, 9:00 AM 11/04/2015

## 2015-11-05 LAB — URINALYSIS W MICROSCOPIC + REFLEX CULTURE
Bilirubin Urine: NEGATIVE
Casts: NONE SEEN [LPF]
Crystals: NONE SEEN [HPF]
Glucose, UA: NEGATIVE
Ketones, ur: NEGATIVE
Nitrite: NEGATIVE
Protein, ur: NEGATIVE
RBC / HPF: NONE SEEN RBC/HPF (ref <=2)
Specific Gravity, Urine: 1.021 (ref 1.001–1.035)
WBC, UA: >60 WBC/HPF — AB (ref <=5)
Yeast: NONE SEEN [HPF]
pH: 5.5 (ref 5.0–8.0)

## 2015-11-06 ENCOUNTER — Other Ambulatory Visit: Payer: Self-pay | Admitting: Gynecology

## 2015-11-06 DIAGNOSIS — R7989 Other specified abnormal findings of blood chemistry: Secondary | ICD-10-CM

## 2015-11-06 LAB — URINE CULTURE
Colony Count: NO GROWTH
Organism ID, Bacteria: NO GROWTH

## 2015-11-11 ENCOUNTER — Other Ambulatory Visit: Payer: 59

## 2015-11-11 DIAGNOSIS — R7989 Other specified abnormal findings of blood chemistry: Secondary | ICD-10-CM

## 2015-11-11 LAB — CREATININE, SERUM: Creat: 1.08 mg/dL (ref 0.50–1.10)

## 2015-11-18 ENCOUNTER — Other Ambulatory Visit: Payer: Self-pay

## 2015-11-18 DIAGNOSIS — Z1231 Encounter for screening mammogram for malignant neoplasm of breast: Secondary | ICD-10-CM

## 2015-12-05 ENCOUNTER — Ambulatory Visit
Admission: RE | Admit: 2015-12-05 | Discharge: 2015-12-05 | Disposition: A | Discharge status: Home / Self Care | Payer: 59 | Source: Ambulatory Visit

## 2015-12-05 DIAGNOSIS — Z1231 Encounter for screening mammogram for malignant neoplasm of breast: Secondary | ICD-10-CM

## 2015-12-06 ENCOUNTER — Encounter: Payer: 59 | Admitting: Gynecology

## 2016-01-14 ENCOUNTER — Ambulatory Visit (HOSPITAL_COMMUNITY): Payer: 59 | Admitting: Psychiatry

## 2016-03-02 ENCOUNTER — Ambulatory Visit (INDEPENDENT_AMBULATORY_CARE_PROVIDER_SITE_OTHER): Payer: 59 | Admitting: Psychiatry

## 2016-03-02 ENCOUNTER — Encounter (HOSPITAL_COMMUNITY): Payer: Self-pay | Admitting: Psychiatry

## 2016-03-02 VITALS — BP 126/80 | HR 76 | Ht 66.0 in | Wt 199.8 lb

## 2016-03-02 DIAGNOSIS — F4001 Agoraphobia with panic disorder: Secondary | ICD-10-CM

## 2016-03-02 NOTE — Progress Notes (Signed)
[facility] Initial Assessment Note  Patty Hale [id number] 45 y.o.  03/02/2016 9:40 AM  Chief Complaint:  I have panic attacks.  History of Present Illness:  Patient is 45 year old African-American, employed, married female who is referred from her primary care physician for the management of her anxiety and panic attacks.  Patient described that she has symptoms of panic attack since college however it has been more intense in recent months.  She had a full-blown panic attack in November when she was driving and feels that she is trapped.  She endorse these panic attacks usually last few minutes and they are very intense.  She told that she stopped flying because of these panic attacks.  Patient endorsed these panic attacks comes with excessive sweating, feeling hot, having shortness of breath and palpitation.  She remembered these stacks in crowded places, tunnels, flying, grocery stores and driving.  Her primary care physician is started her on Lexapro in December and also started Klonopin 0.5 mg as needed for severe panic attack.  She has seen a huge improvement since taking Lexapro.  She is taking 20 mg.  She developed no side effects.  She has noticed after starting Lexapro she has fewer panic attacks but they're less intense and less frequent.  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  She admitted starting to much and sometime late and night.  Patient denies any depressive symptoms including feeling of hopelessness, helplessness or any worthlessness.  She denies any anhedonia, delusion, crying spells, irritability, mania, anger, hallucination or any impulsive behavior.  She denies any OCD or any PTSD symptoms.  She like to continue Lexapro 20 mg.  She has not taken Klonopin so far but she like to keep in the purse in case she needed.  Patient denies drinking or using any illegal substances.  She lives with her husband.  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  Patient denies any other major stressors in her life.  Currently she is not seeing any therapist but she is open to see a counselor as she like to get off from the medication in the future.  She tried stopping Lexapro 2 months ago but her panic attacks come back.  Patient has no tremors, shakes or any EPS.  Her appetite is okay.  Her vitals are stable.  Suicidal Ideation: No Plan Formed: No Patient has means to carry out plan: No  Homicidal Ideation: No Plan Formed: No Patient has means to carry out plan: No  Past Psychiatric History/Hospitalization(s): Patient reported history of panic attacks and anxiety started in her college.  She has a major panic attack when she was flying and going to [place].  After that she has multiple panic attacks and different occasions especially when she is flying, going through panel are driving in a crowded place.  Patient denies any history of psychiatric inpatient treatment or any suicidal attempt.  She do not recall taking any antianxiety medication on a regular basis until recently her primary care physician is started her on Lexapro.  Patient denies any history of mania, psychosis, hallucination, self abusive behavior or any aggression.  Anxiety: Yes Bipolar Disorder: No Depression: No Mania: No Psychosis: No Schizophrenia: No Personality Disorder: No Hospitalization for psychiatric illness: No History of Electroconvulsive Shock Therapy: No Prior Suicide Attempts: No  Family History; Patient endorse father has history of anxiety and panic attacks.  His son has ADD and daughter has panic attacks.  Medical History; Patient has history of shoulder surgery.  Her primary care physician is Dr. Nehemiah Settle.  Patient denies any history of seizures or headaches.  Traumatic brain injury: Patient denies any history of traumatic brain injury.  Education and Work History; Patient has college education.  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  Psychosocial History; Patient born and raised in [place].  She's been married for 22 years.  She lives with her husband.  She is a 20 year old son who is in college.  Her daughter lives in [place] and she is a Insurance underwriter.  Patient has intact family and she has no concerns.  Legal History; Patient denies any legal issues.  History Of Abuse; Patient denies any history of abuse.  Substance Abuse History; Patient denies any history of drinking alcohol or using any illegal substance use.  Review of Systems: Psychiatric: Agitation: No Hallucination: No Depressed Mood: No Insomnia: No Hypersomnia: No Altered Concentration: No Feels Worthless: No Grandiose Ideas: No Belief In Special Powers: No New/Increased Substance Abuse: No Compulsions: No  Neurologic: Headache: No Seizure: No Paresthesias: No   Outpatient Encounter Prescriptions as of 03/02/2016  Medication Sig  . clonazePAM (KLONOPIN) 0.5 MG tablet as needed.  Marland Kitchen escitalopram (LEXAPRO) 20 MG tablet Take 1 tablet by mouth daily.  Marland Kitchen levonorgestrel (MIRENA) 20 MCG/24HR IUD 1 each by Intrauterine route once.     No facility-administered encounter medications on file as of 03/02/2016.    No results found for this or any previous visit (from the past 2160 hour(s)).    Constitutional:  BP 126/80 mmHg  Pulse 76  Ht 5\' 6"  (1.676 m)  Wt 199 lb 12.8 oz (90.629 kg)  BMI 32.26 kg/m2   Musculoskeletal: Strength & Muscle Tone: within normal limits Gait & Station: normal Patient leans: N/A  Psychiatric Specialty Exam: General Appearance: Casual and Well Groomed  ::  Good  Speech:  Slow  Volume:  Normal  Mood:  Anxious  Affect:  Congruent  Thought Process:  Coherent  Orientation:  Full (Time, Place, and Person)  Thought Content:  WDL  Suicidal Thoughts:  No  Homicidal Thoughts:  No  Memory:  Immediate;   Good Recent;    Good Remote;   Good  Judgement:  Good  Insight:  Good  Psychomotor Activity:  Normal  Concentration:  Good  Recall:  Fair  Fund of Knowledge:  Good  Language:  Good  Akathisia:  No  Handed:  Right  AIMS (if indicated):     Assets:  Communication Skills Desire for Improvement Financial Resources/Insurance Housing Intimacy Physical Health Social Support Talents/Skills Transportation Vocational/Educational  ADL's:  Intact  Cognition:  WNL  Sleep:        Established Problem, Stable/Improving (1), Review of Psycho-Social Stressors (1), Review or order clinical lab tests (1), Decision to obtain old records (1), Review and summation of old records (2), New Problem, with no additional work-up planned (3) and Review of Medication Regimen & Side Effects (2)  Assessment: Axis I: Panic disorder with agoraphobia mild.  Anxiety disorder NOS  Axis II: Deferred  Axis III:  Past Medical History  Diagnosis Date  . Lesion of breast 01/2015    left  . Dental crowns present      Plan:  I review her symptoms, history, current medication, recent blood work and psychosocial stressors.  She is taking Lexapro  20 mg daily which is prescribed by her primary care physician.  It seems Lexapro is working very well for her and she do not recall any major panic attack since she is taking Lexapro.  She has no side effects.  She was also given Klonopin however she has not taken so far.  I had a long discussion with the patient about medication side effects, benzodiazepine dependence, tolerance and withdrawal.  I encouraged to see a therapist for coping skills.  Patient like to continue Lexapro as prescribed by primary care physician.  At this time we will not schedule any appointment for medication management however she will see a therapist in this office for coping skills.  I recommended to call us back if she feels her symptoms are getting worse.  Discuss safety plan that anytime having active suicidal  thoughts or homicidal thoughts and she need to call 911 or go to the local emergency room.  Jazalynn Mireles T., MD 03/02/2016

## 2016-03-25 ENCOUNTER — Encounter (HOSPITAL_COMMUNITY): Payer: Self-pay | Admitting: Clinical

## 2016-03-25 ENCOUNTER — Ambulatory Visit (INDEPENDENT_AMBULATORY_CARE_PROVIDER_SITE_OTHER): Payer: 59 | Admitting: Clinical

## 2016-03-25 DIAGNOSIS — F4001 Agoraphobia with panic disorder: Secondary | ICD-10-CM

## 2016-03-30 NOTE — Progress Notes (Signed)
Comprehensive Clinical Assessment (CCA) Note  03/30/2016 Patty Hale [id number]  Visit Diagnosis:      ICD-9-CM ICD-10-CM   1. Panic disorder with agoraphobia and moderate panic attacks 300.21 F40.01       CCA Part One  Part One has been completed on paper by the patient.  (See scanned document in Chart Review)  CCA Part Two A  Intake/Chief Complaint:  CCA Intake With Chief Complaint CCA Part Two Time: 1104 Chief Complaint/Presenting Problem: ANxiety - improving with medication, Was having panic attacks recently  Patients Currently Reported Symptoms/Problems: Was taking CPA exam - Has had anxiety for past 10 years and was having panic attacks every other day before starting the medication Individual's Strengths: "I have a lot of family support." Individual's Preferences: "I would like to be able to fly in plane with out having something to calm me down, I would like to be able to go to a crowdwd place or be in traffic or at [place] and not have a panic attack." Type of Services Patient Feels Are Needed: Individual Therapy  Mental Health Symptoms Depression:  Depression: N/A  Mania:  Mania: N/A  Anxiety:   Anxiety: Tension, Worrying, Difficulty concentrating  Psychosis:  Psychosis: N/A  Trauma:  Trauma: N/A  Obsessions:  Obsessions: N/A  Compulsions:  Compulsions: N/A  Inattention:  Inattention: N/A  Hyperactivity/Impulsivity:  Hyperactivity/Impulsivity: N/A  Oppositional/Defiant Behaviors:  Oppositional/Defiant Behaviors: N/A  Borderline Personality:  Emotional Irregularity: N/A  Other Mood/Personality Symptoms:      Mental Status Exam Appearance and self-care  Stature:  Stature: Average  Weight:  Weight: Average weight  Clothing:  Clothing: Casual  Grooming:  Grooming: Normal  Cosmetic use:  Cosmetic Use: Age appropriate  Posture/gait:  Posture/Gait: Normal  Motor activity:  Motor Activity: Not Remarkable  Sensorium  Attention:  Attention: Normal  Concentration:   Concentration: Normal  Orientation:  Orientation: X5  Recall/memory:  Recall/Memory: Normal  Affect and Mood  Affect:  Affect: Appropriate  Mood:     Relating  Eye contact:  Eye Contact: Normal  Facial expression:  Facial Expression: Responsive  Attitude toward examiner:  Attitude Toward Examiner: Cooperative  Thought and Language  Speech flow: Speech Flow: Normal  Thought content:  Thought Content: Appropriate to mood and circumstances  Preoccupation:     Hallucinations:     Organization:     Company secretary of Knowledge:  Fund of Knowledge: Average  Intelligence:  Intelligence: Average  Abstraction:  Abstraction: Normal  Judgement:  Judgement: Normal  Reality Testing:  Reality Testing: Realistic  Insight:  Insight: Fair  Decision Making:  Decision Making: Normal  Social Functioning  Social Maturity:  Social Maturity: Responsible  Social Judgement:  Social Judgement: Normal  Stress  Stressors:  Stressors: Arts administrator, Transitions, Work Recruitment consultant exam and now waiting for results)  Coping Ability:  Coping Ability: Normal  Skill Deficits:     Supports:      Family and Psychosocial History: Family history Marital status: Married Number of Years Married: 23 What types of issues is patient dealing with in the relationship?: We get along good Are you sexually active?: Yes What is your sexual orientation?: Heterosexual  Has your sexual activity been affected by drugs, alcohol, medication, or emotional stress?: it has been when panic attacks were more frequent  Does patient have children?: Yes How many children?: 3 How is patient's relationship with their children?: 28 - step Whitney, 22 Staci, 19 Earl    - I get  a long great with children  Childhood History:  Childhood History By whom was/is the patient raised?: Both parents Additional childhood history information: Growing up was good. I was oldest. We ate dinner together. I was in track and in the band. Parents were  very supportive.  Description of patient's relationship with caregiver when they were a child: Got a long good with parents when growing up Patient's description of current relationship with people who raised him/her: It is good How were you disciplined when you got in trouble as a child/adolescent?: sometimes i got a whooping, When I got older stand in the corner or no tv Does patient have siblings?: Yes Number of Siblings: 2 Description of patient's current relationship with siblings: Good Did patient suffer any verbal/emotional/physical/sexual abuse as a child?: No Did patient suffer from severe childhood neglect?: No Has patient ever been sexually abused/assaulted/raped as an adolescent or adult?: No Was the patient ever a victim of a crime or a disaster?: No Witnessed domestic violence?: No Has patient been effected by domestic violence as an adult?: No  CCA Part Two B  Employment/Work Situation: Employment / Work Psychologist, occupational Employment situation: Employed 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[employer] 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 years Patient's job has been impacted by current illness: No What is the longest time patient has a held a job?: 23 years Where was the patient employed at that time?: Armed forces operational officer for [employer]  Has patient ever been in the [employer]?: No Are There Guns or Other Weapons in Your Home?: No  Education: Education Name of [employer] School: [employer]  Did Garment/textile technologist From [employer]?: Yes Did Theme park manager?: Yes What Type of College Degree Do you Have?: BS in Accounting  Did You Attend Graduate School?: No Did You Have An Individualized Education Program (IIEP): No Did You Have Any Difficulty At [employer]?: No  Religion: Religion/Spirituality Are You A Religious Person?: Yes What is Your Religious Affiliation?: Baptist How Might This Affect Treatment?: None   Leisure/Recreation: Leisure /  Recreation Leisure and Hobbies: "I don't know now. I like to walk around the park and just hang out, I like to play tennis, do family stuff."  Exercise/Diet: Exercise/Diet Do You Exercise?: No Have You Gained or Lost A Significant Amount of Weight in the Past Six Months?: Yes-Gained Number of Pounds Gained: 20 Do You Follow a Special Diet?: No Do You Have Any Trouble Sleeping?: Yes Explanation of Sleeping Difficulties: waking up after I sleep.   CCA Part Two C  Alcohol/Drug Use: Alcohol / Drug Use History of alcohol / drug use?: No history of alcohol / drug abuse                      CCA Part Three  ASAM's:  Six Dimensions of Multidimensional Assessment  Dimension 1:  Acute Intoxication and/or Withdrawal Potential:     Dimension 2:  Biomedical Conditions and Complications:     Dimension 3:  Emotional, Behavioral, or Cognitive Conditions and Complications:     Dimension 4:  Readiness to Change:     Dimension 5:  Relapse, Continued use, or Continued Problem Potential:     Dimension 6:  Recovery/Living Environment:      Substance use Disorder (SUD)    Social Function:  Social Functioning Social Maturity: Responsible Social Judgement: Normal  Stress:  Stress Stressors: Money, Transitions, Work Recruitment consultant exam and now waiting for results) Coping Ability: Normal  Patient Takes Medications The Way The Doctor Instructed?: Yes Priority Risk: Low Acuity  Risk Assessment- Self-Harm Potential: Risk Assessment For Self-Harm Potential Thoughts of Self-Harm: No current thoughts Method: No plan Availability of Means: No access/NA  Risk Assessment -Dangerous to Others Potential: Risk Assessment For Dangerous to Others Potential Method: No Plan Availability of Means: No access or NA Intent: Vague intent or NA Notification Required: No need or identified person  DSM5 Diagnoses: Patient Active Problem List   Diagnosis Date Noted  . Radial scar of breast 03/21/2015  .  Dense breast tissue 03/21/2015  . IUD (intrauterine device) in place 10/11/2013  . Mastodynia 01/25/2013  . Hx gestational diabetes 10/05/2011    Patient Centered Plan: Patient is on the following Treatment Plan(s):  Treatment plan to be formulated at next session Individual therapy 1x every 1-2 weeks, sessions to become less frequent as symptoms improve  Recommendations for Services/Supports/Treatments: Recommendations for Services/Supports/Treatments Recommendations For Services/Supports/Treatments: Individual Therapy, Medication Management  Treatment Plan Summary:    Referrals to Alternative Service(s): Referred to Alternative Service(s):   Place:   Date:   Time:    Referred to Alternative Service(s):   Place:   Date:   Time:    Referred to Alternative Service(s):   Place:   Date:   Time:    Referred to Alternative Service(s):   Place:   Date:   Time:     Tekisha Darcey A

## 2016-04-28 ENCOUNTER — Encounter (HOSPITAL_COMMUNITY): Payer: Self-pay | Admitting: Clinical

## 2016-04-28 ENCOUNTER — Ambulatory Visit (INDEPENDENT_AMBULATORY_CARE_PROVIDER_SITE_OTHER): Payer: 59 | Admitting: Clinical

## 2016-04-28 DIAGNOSIS — F4001 Agoraphobia with panic disorder: Secondary | ICD-10-CM | POA: Diagnosis not present

## 2016-04-28 NOTE — Progress Notes (Signed)
   THERAPIST PROGRESS NOTE  Session Time: 4:35 - 5:30   Participation Level: Active  Behavioral Response: CasualAlertAnxious  Type of Therapy: Individual Therapy  Treatment Goals addressed: improve psychiatric symptoms, reduce anxiety (increased social interaction, take a flight, go to store without having a panic attack), improve unhelpful thought patterns, stress management skills (stress reduction and stress tolerance), reduce irrational fears and worries,  reduce anxiety attacks, learn about diagnosis, implement healthy Coping skills, reduce panic attacks.  Interventions: CBT and Motivational Interviewing, psycho education, grounding and mindfulness techniques   Summary: Patty Hale is a 45 y.o. female who presents with Panic disorder with agoraphobia and moderate panic attacks.   Suicidal/Homicidal: Nowithout intent/plan  Therapist Response: Patty Hale met with clinician for an individual session. Patty Hale discussed her psychiatric symptoms, her current life events, and her goals for therapy. Patty Hale shared a bout her panic attacks. Clinician asked open ended questions and she shared about her last panic. Clinician introduced some basic CBT concepts. Client and clinician discussed how our thoughts affect our emotions and perceptions. Client and clinician discussed the false alarm nature of panic attacks. Patty Hale shared about her thoughts prior to her last panic attack and during and after. Client and clinician discussed the evidence for and against some of those thoughts. Patty Hale  was able to formulate some healthier alternative thoughts. Clinician introduced a homework packet on panic and Patty Hale agreed to complete them and bring them back with her to next session. Clinician introduced some grounding and mindfulness techniques a 10 x 3 category game and 478 breathing. Client and clinician practice the techniques together. Clinician explained the process the purpose and the practice of the techniques.  Patty Hale  agreed to practice the techniques daily until next session.   Plan: Return again in 1-2 weeks.  Diagnosis: Axis I: Panic disorder with agoraphobia and moderate panic attacks       Wilder Kurowski A, LCSW 04/28/2016

## 2016-05-12 ENCOUNTER — Encounter (HOSPITAL_COMMUNITY): Payer: Self-pay | Admitting: Clinical

## 2016-05-12 ENCOUNTER — Ambulatory Visit (INDEPENDENT_AMBULATORY_CARE_PROVIDER_SITE_OTHER): Payer: 59 | Admitting: Clinical

## 2016-05-12 DIAGNOSIS — F4001 Agoraphobia with panic disorder: Secondary | ICD-10-CM | POA: Diagnosis not present

## 2016-05-12 NOTE — Progress Notes (Signed)
   THERAPIST PROGRESS NOTE  Session Time: 4:35 - 5:30  Participation Level: Active  Behavioral Response: CasualAlertAnxious  Type of Therapy: Individual Therapy  Treatment Goals addressed: improve psychiatric symptoms, reduce anxiety , improve unhelpful thought patterns, , reduce irrational fears and worries,  reduce anxiety attacks, learn about diagnosis, implement healthy Coping skills,   Interventions: CBT and Motivational Interviewing,   Summary: Patty Hale is a 45 y.o. female who presents with Panic disorder with agoraphobia and moderate panic attacks.   Suicidal/Homicidal: Nowithout intent/plan  Therapist Response: [name] met with clinician for an individual session. [name] discussed her psychiatric symptoms, her current life events, and homework. [name] shared that she was doing a little bit better (less panic attacks). [name] shared that she was to talk herself out of one attack after hearing from clinician that panic attacks are false alarms. She shared that she completed packet 1&2 of panic. Client and clinician reviewed and discussed her homework. [name] shared her thoughts and insights. Clinician provided the next packet for [name]. Clinician introduced a 7 panel thought record sheet. Client and clinician completed the worksheet together on the negative thoughts that proceeded a recent "big" panic attack. [name] shared her negative thoughts and emotions. She rated her negative emotions. Client and clinician discussed the evidence for and against the negative thoughts. [name] then identified the evidence for and against the thoughts. She was then able to formulated healthier alternative thoughts. She rerated her emotions as much lower. Client and clinician discussed her thoughts and insights.  Plan: Return again in 1-2 weeks.  Diagnosis: Axis I: Panic disorder with agoraphobia and moderate panic attacks                             Hisao Doo A, LCSW 05/12/2016

## 2016-05-26 ENCOUNTER — Ambulatory Visit (INDEPENDENT_AMBULATORY_CARE_PROVIDER_SITE_OTHER): Payer: 59 | Admitting: Clinical

## 2016-05-26 DIAGNOSIS — F4001 Agoraphobia with panic disorder: Secondary | ICD-10-CM | POA: Diagnosis not present

## 2016-05-28 NOTE — Progress Notes (Signed)
   THERAPIST PROGRESS NOTE  Session Time: 4:36 - 4:22  Participation Level: Active  Behavioral Response: CasualAlertNA  Type of Therapy: Individual Therapy  Treatment Goals addressed: improve psychiatric symptoms, reduce anxiety , improve unhelpful thought patterns, , reduce irrational fears and worries,  reduce anxiety attacks, learn about diagnosis, implement healthy Coping skills,   Interventions: CBT and Motivational Interviewing, psycho education,  Summary: Patty Hale is a 45 y.o. female who presents with Panic disorder with agoraphobia and moderate panic attacks.   Suicidal/Homicidal: Nowithout intent/plan  Therapist Response: Porsha met with clinician for an individual session. Florabelle discussed her psychiatric symptoms, her current life events, and homework. Jomaira shared that she had only a few panic attacks since last session.  Client and clinician discussed the panic attacks and her response. Client and clinician discussed  Ways to improve her outcomes.  Client and clinician discussed thoughts and emotions. Client and clinician dicussed expectation and anxiety. Client and clinician discussed focus. Clair shared that she has been practicing her grounding and mindfulness techniques. She agreed to continue to practice until next sessionClient and clinician reviewed  And discussed her homework packet Panic #3. Clinician gave her panic #4 which she agreed to complete it and bring it back next session.  Plan: Return again in 1-2 weeks.  Diagnosis: Axis I: Panic disorder with agoraphobia and moderate panic attacks                           Ansley Stanwood A, LCSW 05/28/2016

## 2016-06-01 ENCOUNTER — Encounter (HOSPITAL_COMMUNITY): Payer: Self-pay | Admitting: Clinical

## 2016-06-15 ENCOUNTER — Ambulatory Visit (INDEPENDENT_AMBULATORY_CARE_PROVIDER_SITE_OTHER): Payer: 59 | Admitting: Clinical

## 2016-06-15 DIAGNOSIS — F4001 Agoraphobia with panic disorder: Secondary | ICD-10-CM | POA: Diagnosis not present

## 2016-06-15 NOTE — Progress Notes (Signed)
   THERAPIST PROGRESS NOTE  Session Time: 7:07 - 7:54   Participation Level: Active  Behavioral Response: CasualAlertNA   Type of Therapy: Individual Therapy  Treatment Goals addressed: improve psychiatric symptoms, reduce anxiety (increased social interaction, take a flight, go to store without having a panic attack), improve unhelpful thought patterns, stress management skills (stress reduction and stress tolerance), reduce irrational fears and worries,  reduce anxiety attacks, learn about diagnosis, implement healthy Coping skills, reduce panic attacks.  Interventions: CBT and Motivational Interviewing, psycho education, grounding and mindfulness techniques   Summary: Patty Hale is a 45 y.o. female who presents with Panic disorder with agoraphobia and moderate panic attacks.   Suicidal/Homicidal: Nowithout intent/plan  Therapist Response: Maille met with clinician for an individual session. Ellysia discussed her psychiatric symptoms, her current life events, and homework. Lus shared that she only had one panic attack since last session. Client and clinician discussed the factors around the panic attack. Client and clinician discussed her negative automatic thoughts. Client and clinician discussed the evidence for and the evidence against that automatic thoughts. Jacqueleen was able to formulate healthier alternative thoughts that she could use in the future in similar situations. Amrutha shared that she completed the panic attack packet 4. Client and clinician reviewed and discussed the packet. Rekita shared her thoughts and insights. Radie shared about an activity that causes her to have anxiety and if she participated in would cause panic attack. Client and clinician discussed her anxiety. Client and clinician discussed her negative automatic thoughts. And as identified the evidence for and against the thoughts. Client and clinician discussed taking small steps towards participating in that event.  Client and clinician reviewed and discussed her grounding and mindfulness techniques. Denez shared that she continues to practice them. Client and clinician discussed additional techniques.  Plan: Return again in 1-2 weeks.  Diagnosis: Axis I: Panic disorder with agoraphobia and moderate panic attacks                            Matilda Fleig A, LCSW 06/15/2016

## 2016-06-18 ENCOUNTER — Encounter (HOSPITAL_COMMUNITY): Payer: Self-pay | Admitting: Clinical

## 2016-06-25 ENCOUNTER — Encounter: Payer: Self-pay | Admitting: Gynecology

## 2016-06-25 ENCOUNTER — Ambulatory Visit (INDEPENDENT_AMBULATORY_CARE_PROVIDER_SITE_OTHER): Payer: 59 | Admitting: Gynecology

## 2016-06-25 VITALS — BP 128/76

## 2016-06-25 DIAGNOSIS — Z23 Encounter for immunization: Secondary | ICD-10-CM

## 2016-06-25 DIAGNOSIS — N898 Other specified noninflammatory disorders of vagina: Secondary | ICD-10-CM | POA: Diagnosis not present

## 2016-06-25 DIAGNOSIS — N9489 Other specified conditions associated with female genital organs and menstrual cycle: Secondary | ICD-10-CM

## 2016-06-25 DIAGNOSIS — L292 Pruritus vulvae: Secondary | ICD-10-CM | POA: Diagnosis not present

## 2016-06-25 DIAGNOSIS — N760 Acute vaginitis: Secondary | ICD-10-CM | POA: Diagnosis not present

## 2016-06-25 DIAGNOSIS — A499 Bacterial infection, unspecified: Secondary | ICD-10-CM

## 2016-06-25 DIAGNOSIS — B9689 Other specified bacterial agents as the cause of diseases classified elsewhere: Secondary | ICD-10-CM

## 2016-06-25 LAB — WET PREP FOR TRICH, YEAST, CLUE
Clue Cells Wet Prep HPF POC: NONE SEEN
Trich, Wet Prep: NONE SEEN
Yeast Wet Prep HPF POC: NONE SEEN

## 2016-06-25 LAB — URINALYSIS W MICROSCOPIC + REFLEX CULTURE
Bilirubin Urine: NEGATIVE
Casts: NONE SEEN [LPF]
Crystals: NONE SEEN [HPF]
Glucose, UA: NEGATIVE
Hgb urine dipstick: NEGATIVE
Ketones, ur: NEGATIVE
Leukocytes, UA: NEGATIVE
Nitrite: NEGATIVE
Protein, ur: NEGATIVE
RBC / HPF: NONE SEEN RBC/HPF (ref <=2)
Specific Gravity, Urine: 1.02 (ref 1.001–1.035)
WBC, UA: NONE SEEN WBC/HPF (ref <=5)
Yeast: NONE SEEN [HPF]
pH: 6 (ref 5.0–8.0)

## 2016-06-25 MED ORDER — FLUCONAZOLE 150 MG PO TABS: 150.0000 mg | ORAL_TABLET | Freq: Once | ORAL | 0 refills | Status: AC

## 2016-06-25 MED ORDER — TINIDAZOLE 500 MG PO TABS: ORAL_TABLET | ORAL | 0 refills | Status: DC

## 2016-06-25 NOTE — Patient Instructions (Addendum)
Influenza Virus Vaccine (Flucelvax) What is this medicine? INFLUENZA VIRUS VACCINE (in floo EN zuh VAHY ruhs vak SEEN) helps to reduce the risk of getting influenza also known as the flu. The vaccine only helps protect you against some strains of the flu. This medicine may be used for other purposes; ask your health care provider or pharmacist if you have questions. What should I tell my health care provider before I take this medicine? They need to know if you have any of these conditions: -bleeding disorder like hemophilia -fever or infection -Guillain-Barre syndrome or other neurological problems -immune system problems -infection with the human immunodeficiency virus (HIV) or AIDS -low blood platelet counts -multiple sclerosis -an unusual or allergic reaction to influenza virus vaccine, other medicines, foods, dyes or preservatives -pregnant or trying to get pregnant -breast-feeding How should I use this medicine? This vaccine is for injection into a muscle. It is given by a health care professional. A copy of Vaccine Information Statements will be given before each vaccination. Read this sheet carefully each time. The sheet may change frequently. Talk to your pediatrician regarding the use of this medicine in children. Special care may be needed. Overdosage: If you think you've taken too much of this medicine contact a poison control center or emergency room at once. Overdosage: If you think you have taken too much of this medicine contact a poison control center or emergency room at once. NOTE: This medicine is only for you. Do not share this medicine with others. What if I miss a dose? This does not apply. What may interact with this medicine? -chemotherapy or radiation therapy -medicines that lower your immune system like etanercept, anakinra, infliximab, and adalimumab -medicines that treat or prevent blood clots like warfarin -phenytoin -steroid medicines like prednisone or  cortisone -theophylline -vaccines This list may not describe all possible interactions. Give your health care provider a list of all the medicines, herbs, non-prescription drugs, or dietary supplements you use. Also tell them if you smoke, drink alcohol, or use illegal drugs. Some items may interact with your medicine. What should I watch for while using this medicine? Report any side effects that do not go away within 3 days to your doctor or health care professional. Call your health care provider if any unusual symptoms occur within 6 weeks of receiving this vaccine. You may still catch the flu, but the illness is not usually as bad. You cannot get the flu from the vaccine. The vaccine will not protect against colds or other illnesses that may cause fever. The vaccine is needed every year. What side effects may I notice from receiving this medicine? Side effects that you should report to your doctor or health care professional as soon as possible: -allergic reactions like skin rash, itching or hives, swelling of the face, lips, or tongue Side effects that usually do not require medical attention (Report these to your doctor or health care professional if they continue or are bothersome.): -fever -headache -muscle aches and pains -pain, tenderness, redness, or swelling at the injection site -tiredness This list may not describe all possible side effects. Call your doctor for medical advice about side effects. You may report side effects to FDA at 1-800-FDA-1088. Where should I keep my medicine? The vaccine will be given by a health care professional in a clinic, pharmacy, doctor's office, or other health care setting. You will not be given vaccine doses to store at home. NOTE: This sheet is a summary. It may not cover   all possible information. If you have questions about this medicine, talk to your doctor, pharmacist, or health care provider.    2016, Elsevier/Gold Standard. (2011-09-16  14:06:47)   Bacterial Vaginosis Bacterial vaginosis is a vaginal infection that occurs when the normal balance of bacteria in the vagina is disrupted. It results from an overgrowth of certain bacteria. This is the most common vaginal infection in women of childbearing age. Treatment is important to prevent complications, especially in pregnant women, as it can cause a premature delivery. CAUSES  Bacterial vaginosis is caused by an increase in harmful bacteria that are normally present in smaller amounts in the vagina. Several different kinds of bacteria can cause bacterial vaginosis. However, the reason that the condition develops is not fully understood. RISK FACTORS Certain activities or behaviors can put you at an increased risk of developing bacterial vaginosis, including:  Having a new sex partner or multiple sex partners.  Douching.  Using an intrauterine device (IUD) for contraception. Women do not get bacterial vaginosis from toilet seats, bedding, swimming pools, or contact with objects around them. SIGNS AND SYMPTOMS  Some women with bacterial vaginosis have no signs or symptoms. Common symptoms include:  Grey vaginal discharge.  A fishlike odor with discharge, especially after sexual intercourse.  Itching or burning of the vagina and vulva.  Burning or pain with urination. DIAGNOSIS  Your health care provider will take a medical history and examine the vagina for signs of bacterial vaginosis. A sample of vaginal fluid may be taken. Your health care provider will look at this sample under a microscope to check for bacteria and abnormal cells. A vaginal pH test may also be done.  TREATMENT  Bacterial vaginosis may be treated with antibiotic medicines. These may be given in the form of a pill or a vaginal cream. A second round of antibiotics may be prescribed if the condition comes back after treatment. Because bacterial vaginosis increases your risk for sexually transmitted  diseases, getting treated can help reduce your risk for chlamydia, gonorrhea, HIV, and herpes. HOME CARE INSTRUCTIONS   Only take over-the-counter or prescription medicines as directed by your health care provider.  If antibiotic medicine was prescribed, take it as directed. Make sure you finish it even if you start to feel better.  Tell all sexual partners that you have a vaginal infection. They should see their health care provider and be treated if they have problems, such as a mild rash or itching.  During treatment, it is important that you follow these instructions:  Avoid sexual activity or use condoms correctly.  Do not douche.  Avoid alcohol as directed by your health care provider.  Avoid breastfeeding as directed by your health care provider. SEEK MEDICAL CARE IF:   Your symptoms are not improving after 3 days of treatment.  You have increased discharge or pain.  You have a fever. MAKE SURE YOU:   Understand these instructions.  Will watch your condition.  Will get help right away if you are not doing well or get worse. FOR MORE INFORMATION  Centers for Disease Control and Prevention, Division of STD Prevention: SolutionApps.[employer].za American Sexual Health Association (ASHA): www.ashastd.org    This information is not intended to replace advice given to you by your health care provider. Make sure you discuss any questions you have with your health care provider.   Document Released: 10/05/2005 Document Revised: 10/26/2014 Document Reviewed: 05/17/2013 Elsevier Interactive Patient Education 2016 Elsevier Inc. Fluconazole tablets What is this medicine? FLUCONAZOLE (  floo KON na zole) is an antifungal medicine. It is used to treat certain kinds of fungal or yeast infections. This medicine may be used for other purposes; ask your health care provider or pharmacist if you have questions. What should I tell my health care provider before I take this medicine? They need to  know if you have any of these conditions: -electrolyte abnormalities -history of irregular heart beat -kidney disease -an unusual or allergic reaction to fluconazole, other azole antifungals, medicines, foods, dyes, or preservatives -pregnant or trying to get pregnant -breast-feeding How should I use this medicine? Take this medicine by mouth. Follow the directions on the prescription label. Do not take your medicine more often than directed. Talk to your pediatrician regarding the use of this medicine in children. Special care may be needed. This medicine has been used in children as young as 76 months of age. Overdosage: If you think you have taken too much of this medicine contact a poison control center or emergency room at once. NOTE: This medicine is only for you. Do not share this medicine with others. What if I miss a dose? If you miss a dose, take it as soon as you can. If it is almost time for your next dose, take only that dose. Do not take double or extra doses. What may interact with this medicine? Do not take this medicine with any of the following medications: -astemizole -certain medicines for irregular heart beat like dofetilide, dronedarone, quinidine -cisapride -erythromycin -lomitapide -other medicines that prolong the QT interval (cause an abnormal heart rhythm) -pimozide -terfenadine -thioridazine -tolvaptan -ziprasidone This medicine may also interact with the following medications: -antiviral medicines for HIV or AIDS -birth control pills -certain antibiotics like rifabutin, rifampin -certain medicines for blood pressure like amlodipine, isradipine, felodipine, hydrochlorothiazide, losartan, nifedipine -certain medicines for cancer like cyclophosphamide, vinblastine, vincristine -certain medicines for cholesterol like atorvastatin, lovastatin, fluvastatin, simvastatin -certain medicines for depression, anxiety, or psychotic disturbances like amitriptyline,  midazolam, nortriptyline, triazolam -certain medicines for diabetes like glipizide, glyburide, tolbutamide -certain medicines for pain like alfentanil, fentanyl, methadone -certain medicines for seizures like carbamazepine, phenytoin -certain medicines that treat or prevent blood clots like warfarin -halofantrine -medicines that lower your chance of fighting infection like cyclosporine, prednisone, tacrolimus -NSAIDS, medicines for pain and inflammation, like celecoxib, diclofenac, flurbiprofen, ibuprofen, meloxicam, naproxen -other medicines for fungal infections -sirolimus -theophylline -tofacitinib This list may not describe all possible interactions. Give your health care provider a list of all the medicines, herbs, non-prescription drugs, or dietary supplements you use. Also tell them if you smoke, drink alcohol, or use illegal drugs. Some items may interact with your medicine. What should I watch for while using this medicine? Visit your doctor or health care professional for regular checkups. If you are taking this medicine for a long time you may need blood work. Tell your doctor if your symptoms do not improve. Some fungal infections need many weeks or months of treatment to cure. Alcohol can increase possible damage to your liver. Avoid alcoholic drinks. If you have a vaginal infection, do not have sex until you have finished your treatment. You can wear a sanitary napkin. Do not use tampons. Wear freshly washed cotton, not synthetic, panties. What side effects may I notice from receiving this medicine? Side effects that you should report to your doctor or health care professional as soon as possible: -allergic reactions like skin rash or itching, hives, swelling of the lips, mouth, tongue, or throat -dark urine -feeling dizzy  or faint -irregular heartbeat or chest pain -redness, blistering, peeling or loosening of the skin, including inside the mouth -trouble breathing -unusual  bruising or bleeding -vomiting -yellowing of the eyes or skin Side effects that usually do not require medical attention (report to your doctor or health care professional if they continue or are bothersome): -changes in how food tastes -diarrhea -headache -stomach upset or nausea This list may not describe all possible side effects. Call your doctor for medical advice about side effects. You may report side effects to FDA at 1-800-FDA-1088. Where should I keep my medicine? Keep out of the reach of children. Store at room temperature below 30 degrees C (86 degrees F). Throw away any medicine after the expiration date. NOTE: This sheet is a summary. It may not cover all possible information. If you have questions about this medicine, talk to your doctor, pharmacist, or health care provider.    2016, Elsevier/Gold Standard. (2013-05-13 16:13:04) Tinidazole tablets What is this medicine? TINIDAZOLE (tye NI da zole) is an antiinfective. It is used to treat amebiasis, giardiasis, trichomoniasis, and vaginosis. It will not work for colds, flu, or other viral infections. This medicine may be used for other purposes; ask your health care provider or pharmacist if you have questions. What should I tell my health care provider before I take this medicine? They need to know if you have any of these conditions: -anemia or other blood disorders -if you frequently drink alcohol containing drinks -receiving hemodialysis -seizure disorder -an unusual or allergic reaction to tinidazole, other medicines, foods, dyes, or preservatives -pregnant or trying to get pregnant -breast-feeding How should I use this medicine? Take this medicine by mouth with a full glass of water. Follow the directions on the prescription label. Take with food. Take your medicine at regular intervals. Do not take your medicine more often than directed. Take all of your medicine as directed even if you think you are better. Do not  skip doses or stop your medicine early. Talk to your pediatrician regarding the use of this medicine in children. While this drug may be prescribed for children as young as 70 years of age for selected conditions, precautions do apply. Overdosage: If you think you have taken too much of this medicine contact a poison control center or emergency room at once. NOTE: This medicine is only for you. Do not share this medicine with others. What if I miss a dose? If you miss a dose, take it as soon as you can. If it is almost time for your next dose, take only that dose. Do not take double or extra doses. What may interact with this medicine? Do not take this medicine with any of the following medications: -alcohol or any product that contains alcohol -amprenavir oral solution -disulfiram -paclitaxel injection -ritonavir oral solution -sertraline oral solution -sulfamethoxazole-trimethoprim injection This medicine may also interact with the following medications: -cholestyramine -cimetidine -conivaptan -cyclosporin -fluorouracil -fosphenytoin, phenytoin -ketoconazole -lithium -phenobarbital -tacrolimus -warfarin This list may not describe all possible interactions. Give your health care provider a list of all the medicines, herbs, non-prescription drugs, or dietary supplements you use. Also tell them if you smoke, drink alcohol, or use illegal drugs. Some items may interact with your medicine. What should I watch for while using this medicine? Tell your doctor or health care professional if your symptoms do not improve or if they get worse. Avoid alcoholic drinks while you are taking this medicine and for three days afterward. Alcohol may make you  feel dizzy, sick, or flushed. If you are being treated for a sexually transmitted disease, avoid sexual contact until you have finished your treatment. Your sexual partner may also need treatment. What side effects may I notice from receiving this  medicine? Side effects that you should report to your doctor or health care professional as soon as possible: -allergic reactions like skin rash, itching or hives, swelling of the face, lips, or tongue -breathing problems -confusion, depression -dark or white patches in the mouth -feeling faint or lightheaded, falls -fever, infection -numbness, tingling, pain or weakness in the hands or feet -pain when passing urine -seizures -unusually weak or tired -vaginal irritation or discharge -vomiting Side effects that usually do not require medical attention (report to your doctor or health care professional if they continue or are bothersome): -dark brown or reddish urine -diarrhea -headache -loss of appetite -metallic taste -nausea -stomach upset This list may not describe all possible side effects. Call your doctor for medical advice about side effects. You may report side effects to FDA at 1-800-FDA-1088. Where should I keep my medicine? Keep out of the reach of children. Store at room temperature between 15 and 30 degrees C (59 and 86 degrees F). Protect from light and moisture. Keep container tightly closed. Throw away any unused medicine after the expiration date. NOTE: This sheet is a summary. It may not cover all possible information. If you have questions about this medicine, talk to your doctor, pharmacist, or health care provider.    2016, Elsevier/Gold Standard. (2008-07-02 15:22:28)

## 2016-06-25 NOTE — Progress Notes (Signed)
   HPI: Patient is a 45 year old who presented to the office complaining of vaginal odor for the past month and a slight discharge patient denies any dysuria or frequency no change in sexual partner has an IUD for contraception. She denies any fever, chills, nausea, vomiting or any back pain. Patient also interest in the flu vaccine today.   ROS: A ROS was performed and pertinent positives and negatives are included in the history.  GENERAL: No fevers or chills. HEENT: No change in vision, no earache, sore throat or sinus congestion. NECK: No pain or stiffness. CARDIOVASCULAR: No chest pain or pressure. No palpitations. PULMONARY: No shortness of breath, cough or wheeze. GASTROINTESTINAL: No abdominal pain, nausea, vomiting or diarrhea, melena or bright red blood per rectum. GENITOURINARY: No urinary frequency, urgency, hesitancy or dysuria. MUSCULOSKELETAL: No joint or muscle pain, no back pain, no recent trauma. DERMATOLOGIC: No rash, no itching, no lesions. ENDOCRINE: No polyuria, polydipsia, no heat or cold intolerance. No recent change in weight. HEMATOLOGICAL: No anemia or easy bruising or bleeding. NEUROLOGIC: No headache, seizures, numbness, tingling or weakness. PSYCHIATRIC: No depression, no loss of interest in normal activity or change in sleep pattern.   PE: Blood pressure 128/76 Gen. appearance well-developed: Nursing with the above-mentioned complaining Back: No CVA tenderness Abdomen: Soft nontender no rebound or guarding Pelvic: Bartholin urethra Skene was within normal limits Vagina: Slight fishy odor but no true discharge seen Cervix: No lesions or discharge IUD string not seen Uterus: Anteverted normal size shape and consistency Adnexa: No palpable masses or tenderness Rectal exam: Not done  Urinalysis few bacteria no white blood cells or red blood cells seen culture submitted  Wet prep few white blood cell moderate bacteria   Assessment Plan: Because his patient's symptoms  and possible sampling error from the wet prep I'm going to treat her for a suspected bacterial vaginosis and because of the vaginal odor that she's been experiencing and noted on exam today with Tindamax 500 mg tablets. She will take 4 tablets today and for talus tomorrow. In the event of vulvar pruritus of also given a prescription of Diflucan 150 mg to take 1 by mouth. She did receive the flu vaccine today. Patient was counseled prior to the flu vaccine and literature information was provided.    Greater than 50% of time was spent in counseling and coordinating care of this patient.   Time of consultation: 15   Minutes.

## 2016-06-26 LAB — URINE CULTURE: Organism ID, Bacteria: NO GROWTH

## 2016-06-30 ENCOUNTER — Ambulatory Visit (INDEPENDENT_AMBULATORY_CARE_PROVIDER_SITE_OTHER): Payer: 59 | Admitting: Clinical

## 2016-06-30 DIAGNOSIS — F4001 Agoraphobia with panic disorder: Secondary | ICD-10-CM

## 2016-07-02 ENCOUNTER — Telehealth: Payer: Self-pay

## 2016-07-02 ENCOUNTER — Encounter (HOSPITAL_COMMUNITY): Payer: Self-pay | Admitting: Clinical

## 2016-07-02 DIAGNOSIS — R5383 Other fatigue: Secondary | ICD-10-CM

## 2016-07-02 DIAGNOSIS — Z1321 Encounter for screening for nutritional disorder: Secondary | ICD-10-CM

## 2016-07-02 DIAGNOSIS — Z1329 Encounter for screening for other suspected endocrine disorder: Secondary | ICD-10-CM

## 2016-07-02 NOTE — Progress Notes (Signed)
   THERAPIST PROGRESS NOTE  Session Time: 3:34 - 4:32  Participation Level: Active  Behavioral Response: CasualAlertNA  Type of Therapy: Individual Therapy  Treatment Goals addressed: improve psychiatric symptoms, reduce anxiety , improve unhelpful thought patterns, stress management skills ( stress tolerance), reduce irrational fears and worries,  reduce anxiety attacks,   reduce panic attacks.  Interventions: CBT and Motivational Interviewing, psycho education, grounding and mindfulness techniques   Summary: Patty Hale is a 45 y.o. female who presents with Panic disorder with agoraphobia and moderate panic attacks.   Suicidal/Homicidal: Nowithout intent/plan  Therapist Response: Patty Hale met with clinician for an individual session. Patty Hale discussed her psychiatric symptoms, her current life events, and homework. Patty Hale shared that she was doing much better. She only had some mild panic attacks. She shared that she accomplished her goal of going through the car wash without having a panic attack. Client and clinician discussed the skills she used to complete this task. Client and clinician discussed grounding and mindfulness techniques client and clinician discussed pack attacks as a false alarm. Patty Hale  Shared that she had increased anxiety after she found out a friend of hers had a heart attack. She shared that she was having psychosomatic symptoms. Client and clinician discussed the importance of taking care of ones health while also honestly evaluating symptoms. Patty Hale shared that she would that the symptoms for heart attack and she has none of them, except a feeling of anxiety. Client and clinician discussed negative automatic thoughts client and clinician discussed challenging unhelpful thoughts. Clinician asked open ended questions and Patty Hale formulated healthier alternative thoughts. Such as if I'm concerned about symptoms I can be evaluated by Dr., heart attacks are not contagious, and this  is a false alarm. Patty Hale did not complete her homework packet #5 panic but agreed to complete and bring it back before next session. She agreed to continue practicing her mindfulness and grounding techniques. She set the goal of challenging something that gives her anxiety but is safe (like going through the car wash)   Plan: Return again in 1-2 weeks.  Diagnosis: Axis I: Panic disorder with agoraphobia and moderate panic attacks                         Elmire Amrein A, LCSW 07/02/2016

## 2016-07-02 NOTE — Telephone Encounter (Signed)
Lab appointment on 07/03/16 @ 4:30pm , orders placed.

## 2016-07-02 NOTE — Telephone Encounter (Signed)
Patient called because she said at last visit she discussed fatigue with you and you mentioned checking a TSH and CBC.  She would like to arrange to come for labs. Ok to order?

## 2016-07-02 NOTE — Telephone Encounter (Signed)
Wishing check a CBC, TSH, hemoglobin A1c and a vitamin D level

## 2016-07-03 ENCOUNTER — Other Ambulatory Visit: Payer: 59

## 2016-07-03 DIAGNOSIS — R5383 Other fatigue: Secondary | ICD-10-CM

## 2016-07-03 DIAGNOSIS — Z1321 Encounter for screening for nutritional disorder: Secondary | ICD-10-CM

## 2016-07-03 DIAGNOSIS — Z1329 Encounter for screening for other suspected endocrine disorder: Secondary | ICD-10-CM

## 2016-07-03 LAB — CBC WITH DIFFERENTIAL/PLATELET
Basophils Absolute: 0 cells/uL (ref 0–200)
Basophils Relative: 0 %
Eosinophils Absolute: 69 cells/uL (ref 15–500)
Eosinophils Relative: 1 %
HCT: 40.1 % (ref 35.0–45.0)
Hemoglobin: 12.7 g/dL (ref 11.7–15.5)
Lymphocytes Relative: 35 %
Lymphs Abs: 2415 cells/uL (ref 850–3900)
MCH: 28.2 pg (ref 27.0–33.0)
MCHC: 31.7 g/dL — ABNORMAL LOW (ref 32.0–36.0)
MCV: 88.9 fL (ref 80.0–100.0)
MPV: 11.6 fL (ref 7.5–12.5)
Monocytes Absolute: 621 cells/uL (ref 200–950)
Monocytes Relative: 9 %
Neutro Abs: 3795 cells/uL (ref 1500–7800)
Neutrophils Relative %: 55 %
Platelets: 236 10*3/uL (ref 140–400)
RBC: 4.51 MIL/uL (ref 3.80–5.10)
RDW: 13.8 % (ref 11.0–15.0)
WBC: 6.9 10*3/uL (ref 3.8–10.8)

## 2016-07-04 LAB — VITAMIN D 25 HYDROXY (VIT D DEFICIENCY, FRACTURES): Vit D, 25-Hydroxy: 15 ng/mL — ABNORMAL LOW (ref 30–100)

## 2016-07-04 LAB — HEMOGLOBIN A1C
Hgb A1c MFr Bld: 5.8 % — ABNORMAL HIGH (ref <5.7)
Mean Plasma Glucose: 120 mg/dL

## 2016-07-04 LAB — TSH: TSH: 2.14 mIU/L

## 2016-07-06 ENCOUNTER — Encounter: Payer: Self-pay | Admitting: Gynecology

## 2016-07-06 ENCOUNTER — Other Ambulatory Visit: Payer: Self-pay | Admitting: Gynecology

## 2016-07-06 DIAGNOSIS — E559 Vitamin D deficiency, unspecified: Secondary | ICD-10-CM

## 2016-07-06 MED ORDER — VITAMIN D (ERGOCALCIFEROL) 1.25 MG (50000 UNIT) PO CAPS: 50000.0000 [IU] | ORAL_CAPSULE | ORAL | 0 refills | Status: DC

## 2016-07-07 ENCOUNTER — Telehealth: Payer: Self-pay

## 2016-07-07 NOTE — Telephone Encounter (Signed)
Patient said her Vit D 50000 Rx was for #4 tabs with one refill. I called and spoke with pharmacist and she assured me patient will have 2 refills and enough for 12 weeks. Her ins will only pay for 1 mos supply at a time. I reiterated to patient that it is for 12 weeks and if any problem at pharmacy getting 3rd refill to call me.

## 2016-07-14 ENCOUNTER — Ambulatory Visit (HOSPITAL_COMMUNITY): Payer: Self-pay | Admitting: Clinical

## 2016-07-15 ENCOUNTER — Other Ambulatory Visit: Payer: 59

## 2016-07-15 ENCOUNTER — Telehealth: Payer: Self-pay | Admitting: *Deleted

## 2016-07-15 DIAGNOSIS — R7309 Other abnormal glucose: Secondary | ICD-10-CM

## 2016-07-15 NOTE — Addendum Note (Signed)
Addended by: Aura Camps on: 07/15/2016 12:25 PM   Modules accepted: Orders

## 2016-07-15 NOTE — Telephone Encounter (Signed)
Lab order was never placed on 07/03/16 result note call.

## 2016-07-16 LAB — GLUCOSE, FASTING: Glucose, Fasting: 88 mg/dL (ref 65–99)

## 2016-07-28 ENCOUNTER — Ambulatory Visit (INDEPENDENT_AMBULATORY_CARE_PROVIDER_SITE_OTHER): Payer: 59 | Admitting: Clinical

## 2016-07-28 DIAGNOSIS — F4001 Agoraphobia with panic disorder: Secondary | ICD-10-CM

## 2016-07-28 NOTE — Progress Notes (Signed)
   THERAPIST PROGRESS NOTE  Session Time: 4:30 - 5:25  Participation Level: Active  Behavioral Response: CasualAlertAnxious  Type of Therapy: Individual Therapy  Treatment Goals addressed: improve psychiatric symptoms, reduce anxiety, improve unhelpful thought patterns, , reduce irrational fears and worries,  reduce anxiety attacks, learn about diagnosis, implement healthy Coping skills, reduce panic attacks.  Interventions: CBT and Motivational Interviewing, psycho education,   Summary: Patty Hale is a 45 y.o. female who presents with Panic disorder with agoraphobia and moderate panic attacks.   Suicidal/Homicidal: Nowithout intent/plan  Therapist Response: Floetta met with clinician for an individual session. Mahika discussed her psychiatric symptoms, her current life events, and homework. Laini shared that she has been doing fairly well with her anxiety since last session. She shared that her panic attacks have decreased. She shared she did as suggested in therapy and has been challenging herself to challenge her anxiety and unhelpful thoughts. Luzelena shared examples of situations that increase her anxiety. Clinician asked open ended questions and Karma reviewed the skills she used and about areas for growth. Client and clinician discussed ways she could continue to challenge her anxiety. Lacara complete packet 5 (panic). Client and clinician reviewed and discussed the packet and her answers. Clinician gave her packet 6.   Plan: Return again in 1-2 weeks.  Diagnosis: Axis I: Panic disorder with agoraphobia and moderate panic attacks                           Angely Dietz A, LCSW 07/28/2016

## 2016-08-04 ENCOUNTER — Encounter (HOSPITAL_COMMUNITY): Payer: Self-pay | Admitting: Clinical

## 2016-08-11 ENCOUNTER — Ambulatory Visit (HOSPITAL_COMMUNITY): Payer: Self-pay | Admitting: Clinical

## 2016-08-25 ENCOUNTER — Ambulatory Visit (HOSPITAL_COMMUNITY): Payer: Self-pay | Admitting: Clinical

## 2016-09-13 ENCOUNTER — Other Ambulatory Visit: Payer: Self-pay | Admitting: Gynecology

## 2016-09-14 ENCOUNTER — Ambulatory Visit (INDEPENDENT_AMBULATORY_CARE_PROVIDER_SITE_OTHER): Payer: 59 | Admitting: Clinical

## 2016-09-14 DIAGNOSIS — F4001 Agoraphobia with panic disorder: Secondary | ICD-10-CM

## 2016-09-14 NOTE — Progress Notes (Signed)
   THERAPIST PROGRESS NOTE  Session Time: 4:35 - 5:30  Participation Level: Active  Behavioral Response: Casual and DisheveledAlertNA  Type of Therapy: Individual Therapy  Treatment Goals addressed: improve psychiatric symptoms, reduce anxiety (increased social interaction, take a flight, go to store without having a panic attack), improve unhelpful thought patterns, stress management skills (stress reduction), reduce irrational fears and worries,  reduce anxiety attacks, learn about diagnosis, implement healthy Coping skills, reduce panic attacks.  Interventions: CBT and Motivational Interviewing, psycho education,   Summary: Patty Hale is a 45 y.o. female who presents with Panic disorder with agoraphobia and moderate panic attacks.   Suicidal/Homicidal: Nowithout intent/plan  Therapist Response: Patty Hale met with clinician for an individual session. Patty Hale discussed her psychiatric symptoms, her current life events, and homework. Patty Hale shared that she had some panic attacks since last session. She shared that she had completed her homework packet Panic 6. Client and clinician reviewed and discussed her homework. She shared about her unhelpful thinking styles. Client and clinician discussed challenging and changing unhelpful thought patterns. Patty Hale shared that she has some up coming events that she is concerned about having panic attacks. Client and clinician discussed her focus and how her focus might affect her outcome. Client and clinician discussed her grounding and mindfulness techniques. Clinician asked open ended questions about the event and some of the good parts, and how she felt when she thought about the good parts. Clinician asked what parts she was anxious about and how she felt when she thought about them. Client and clinician discussed whether or not her anxiety had a message that needed tending to "make sure to stop the mail" or was it just a catastrophic thought  such as "things  will be horrible" Client and clinician discussed how to handle each type. Patty Hale agreed to complete packet 7 for homework.  Plan: Return again in 1 month   Diagnosis: Axis I: Panic disorder with agoraphobia and moderate panic attacks                            Taevin Mcferran A, LCSW 09/14/2016

## 2016-09-21 ENCOUNTER — Encounter (HOSPITAL_COMMUNITY): Payer: Self-pay | Admitting: Clinical

## 2016-09-24 ENCOUNTER — Other Ambulatory Visit: Payer: 59

## 2016-09-24 DIAGNOSIS — E559 Vitamin D deficiency, unspecified: Secondary | ICD-10-CM

## 2016-09-25 LAB — VITAMIN D 25 HYDROXY (VIT D DEFICIENCY, FRACTURES): Vit D, 25-Hydroxy: 43 ng/mL (ref 30–100)

## 2016-09-29 ENCOUNTER — Telehealth: Payer: Self-pay

## 2016-09-29 NOTE — Telephone Encounter (Signed)
Patient called inquiring about her Vit D result from last week. Advised it is normal now and recovered nicely with the Rx.  She will start Vit D 2000 units every day and just stay on it per Dr. Glenetta Hew original result note.

## 2016-10-06 ENCOUNTER — Ambulatory Visit (INDEPENDENT_AMBULATORY_CARE_PROVIDER_SITE_OTHER): Payer: 59 | Admitting: Clinical

## 2016-10-06 DIAGNOSIS — F4001 Agoraphobia with panic disorder: Secondary | ICD-10-CM

## 2016-10-06 NOTE — Progress Notes (Signed)
THERAPIST PROGRESS NOTE  Session Time: 4:32 - 5:20  Participation Level: Active  Behavioral Response: CasualAlertAnxious  Type of Therapy: Individual Therapy  Treatment Goals addressed: improve psychiatric symptoms, reduce anxiety (increased social interaction, take a flight, go to store without having a panic attack), improve unhelpful thought patterns, stress management skills (stress reduction and stress tolerance), reduce irrational fears and worries,  reduce anxiety attacks, learn about diagnosis, implement healthy Coping skills, reduce panic attacks.  Interventions: CBT and Motivational Interviewing, psycho education, grounding and mindfulness techniques   Summary: Patty Hale is a 44 y.o. female who presents with Panic disorder with agoraphobia and moderate panic attacks.   Suicidal/Homicidal: Nowithout intent/plan  Therapist Response: Danella met with clinician for an individual session. Janele discussed her psychiatric symptoms, her current life events, and homework. Darrion shared that she is had some panic attacks. Clinician asked open ended questions. Dianez shared about the panic attacks, her thoughts prior to the panic attacks, the thoughts during the panic attacks, and his thoughts after the panic attacks. Clinician asked open ended questions a bout where she could change her thoughts are behaviors. Zetta identified points in when she did practice her grounding and mindfulness techniques. Diane has and clinician discussed popping. Client and clinician discussed challenging unhelpful thoughts such as I can't breathe. Client and clinician practiced a grounding and mindfulness techniques client and clinician discussed using her CBT skills to challenge those thoughts. Client and clinician discussed using her grounding and mindfulness techniques prior to entering a stressful situation.   Plan: Return again in 1-2 weeks.  Diagnosis: Axis I: Panic disorder with agoraphobia and moderate  panic attacks                            Powell,Frances A, LCSW 10/06/2016  

## 2016-10-07 ENCOUNTER — Encounter (HOSPITAL_COMMUNITY): Payer: Self-pay | Admitting: Clinical

## 2016-10-26 ENCOUNTER — Ambulatory Visit (HOSPITAL_COMMUNITY): Payer: Self-pay | Admitting: Clinical

## 2016-10-29 ENCOUNTER — Other Ambulatory Visit: Payer: Self-pay | Admitting: Gynecology

## 2016-10-29 DIAGNOSIS — Z1231 Encounter for screening mammogram for malignant neoplasm of breast: Secondary | ICD-10-CM

## 2016-11-04 ENCOUNTER — Encounter: Payer: 59 | Admitting: Gynecology

## 2016-11-06 ENCOUNTER — Encounter: Payer: 59 | Admitting: Gynecology

## 2016-11-11 ENCOUNTER — Encounter (HOSPITAL_COMMUNITY): Payer: Self-pay | Admitting: Clinical

## 2016-11-11 ENCOUNTER — Ambulatory Visit (INDEPENDENT_AMBULATORY_CARE_PROVIDER_SITE_OTHER): Payer: 59 | Admitting: Clinical

## 2016-11-11 DIAGNOSIS — F4001 Agoraphobia with panic disorder: Secondary | ICD-10-CM

## 2016-11-11 NOTE — Progress Notes (Signed)
   THERAPIST PROGRESS NOTE  Session Time: 4:30 -5:20  Participation Level: Active  Behavioral Response: CasualAlertAnxious  Type of Therapy: Individual Therapy  Treatment Goals addressed: improve psychiatric symptoms, reduce anxiety.  improve unhelpful thought patterns, stress management skills, reduce irrational fears and worries,  reduce anxiety attacks, learn about diagnosis, implement healthy Coping skills, reduce panic attacks.  Interventions: CBT and Motivational Interviewing,   Summary: Patty Hale is a 45 y.o. female who presents with Panic disorder with agoraphobia and moderate panic attacks.   Suicidal/Homicidal: Nowithout intent/plan  Therapist Response: Patty Hale met with clinician for an individual session. [name] discussed her psychiatric symptoms, her current life events, and homework. [name] shared that she is experiencing less panic attacks. She shared she has been using the skills learned in therapy. She stated that she is experiencing more fear of having a panic attack than actual panic attacks. Client and clinician discussed her thought process, best and worse case scenarios. [name] had the insight that entertaining the fear was more likely to increase the chances of a panic attack. 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 She shared that this was bring up a lot of anxiety and resistance (she has completed parts of the test but has not completed all sections). Clinician asked open ended questions and Patty Hale identified her negative thoughts and emotions. She was then able to identify the evidence for and against the thoughts. She was then able to formulate healthier alternative thoughts. Clinician asked open ended questions and Patty Hale identified several past successes and the way she achieved them. She had the insight that she could succeed at this too if she focused on the skills she has used for success in the past rather than on her worries and fears. She shared that  she had not finished packet 7 (panic) yet and would do so and bring it with her to next session. She stated that she continues to benefit from using her grounding and mindfulness techniques.    Plan: Return again in 1-2 weeks.  Diagnosis: Axis I: Panic disorder with agoraphobia and moderate panic attacks                           Devyn Griffing A, LCSW 11/11/2016

## 2016-11-16 ENCOUNTER — Encounter: Payer: Self-pay | Admitting: Gynecology

## 2016-11-16 ENCOUNTER — Ambulatory Visit (INDEPENDENT_AMBULATORY_CARE_PROVIDER_SITE_OTHER): Payer: 59 | Admitting: Gynecology

## 2016-11-16 VITALS — BP 118/70 | Ht 68.0 in | Wt 211.0 lb

## 2016-11-16 DIAGNOSIS — E559 Vitamin D deficiency, unspecified: Secondary | ICD-10-CM

## 2016-11-16 DIAGNOSIS — Z01411 Encounter for gynecological examination (general) (routine) with abnormal findings: Secondary | ICD-10-CM | POA: Diagnosis not present

## 2016-11-16 DIAGNOSIS — Z30431 Encounter for routine checking of intrauterine contraceptive device: Secondary | ICD-10-CM | POA: Diagnosis not present

## 2016-11-16 LAB — CBC WITH DIFFERENTIAL/PLATELET
Basophils Absolute: 0 cells/uL (ref 0–200)
Basophils Relative: 0 %
Eosinophils Absolute: 56 cells/uL (ref 15–500)
Eosinophils Relative: 1 %
HCT: 39.1 % (ref 35.0–45.0)
Hemoglobin: 12.5 g/dL (ref 11.7–15.5)
Lymphocytes Relative: 37 %
Lymphs Abs: 2072 cells/uL (ref 850–3900)
MCH: 27.9 pg (ref 27.0–33.0)
MCHC: 32 g/dL (ref 32.0–36.0)
MCV: 87.3 fL (ref 80.0–100.0)
MPV: 10.6 fL (ref 7.5–12.5)
Monocytes Absolute: 560 cells/uL (ref 200–950)
Monocytes Relative: 10 %
Neutro Abs: 2912 cells/uL (ref 1500–7800)
Neutrophils Relative %: 52 %
Platelets: 202 10*3/uL (ref 140–400)
RBC: 4.48 MIL/uL (ref 3.80–5.10)
RDW: 14.1 % (ref 11.0–15.0)
WBC: 5.6 10*3/uL (ref 3.8–10.8)

## 2016-11-16 LAB — URINALYSIS W MICROSCOPIC + REFLEX CULTURE
Bilirubin Urine: NEGATIVE
Crystals: NONE SEEN [HPF]
Glucose, UA: NEGATIVE
Hgb urine dipstick: NEGATIVE
Ketones, ur: NEGATIVE
Leukocytes, UA: NEGATIVE
Nitrite: NEGATIVE
Protein, ur: NEGATIVE
RBC / HPF: NONE SEEN RBC/HPF (ref <=2)
Specific Gravity, Urine: 1.023 (ref 1.001–1.035)
Yeast: NONE SEEN [HPF]
pH: 5.5 (ref 5.0–8.0)

## 2016-11-16 LAB — TSH: TSH: 3.51 mIU/L

## 2016-11-16 LAB — LIPID PANEL
Cholesterol: 143 mg/dL (ref <200)
HDL: 41 mg/dL — ABNORMAL LOW (ref >50)
LDL Cholesterol: 86 mg/dL (ref <100)
Total CHOL/HDL Ratio: 3.5 Ratio (ref <5.0)
Triglycerides: 81 mg/dL (ref <150)
VLDL: 16 mg/dL (ref <30)

## 2016-11-16 LAB — COMPREHENSIVE METABOLIC PANEL
ALT: 13 U/L (ref 6–29)
AST: 18 U/L (ref 10–35)
Albumin: 4 g/dL (ref 3.6–5.1)
Alkaline Phosphatase: 66 U/L (ref 33–115)
BUN: 12 mg/dL (ref 7–25)
CO2: 25 mmol/L (ref 20–31)
Calcium: 9.2 mg/dL (ref 8.6–10.2)
Chloride: 106 mmol/L (ref 98–110)
Creat: 1.17 mg/dL — ABNORMAL HIGH (ref 0.50–1.10)
Glucose, Bld: 90 mg/dL (ref 65–99)
Potassium: 4.3 mmol/L (ref 3.5–5.3)
Sodium: 139 mmol/L (ref 135–146)
Total Bilirubin: 0.3 mg/dL (ref 0.2–1.2)
Total Protein: 7 g/dL (ref 6.1–8.1)

## 2016-11-16 NOTE — Progress Notes (Signed)
Patty Hale 1971-07-26 [id number]   History:    46 y.o.  for annual gyn exam who is asymptomatic today.Patient had the Mirena IUD changed in 2015. Patient's having no menstrual cycles and is doing well otherwise. Her flu vaccine is up-to-date. Patient with no previous history of any abnormal Pap smear. Patient is fasting today we'll have her blood work today. Patient had a left lumpectomy last year for benign pathology which had demonstrated fibrocystic changes and scar tissue.  Patient with prior history vitamin D deficiency currently on vitamin D 2000 units daily  Past medical history,surgical history, family history and social history were all reviewed and documented in the EPIC chart.  Gynecologic History No LMP recorded. Patient is not currently having periods (Reason: IUD). Contraception: IUD Last Pap: 2016. Results were: normal Last mammogram: 2017. Results were: Dense but normal had three-dimensional mammogram  Obstetric History OB History  Gravida Para Term Preterm AB Living  2 2 2     2   SAB TAB Ectopic Multiple Live Births          2    # Outcome Date GA Lbr Len/2nd Weight Sex Delivery Anes PTL Lv  2 Term     M Vag-Spont  N LIV  1 Term     F Vag-Spont  N LIV       ROS: A ROS was performed and pertinent positives and negatives are included in the history.  GENERAL: No fevers or chills. HEENT: No change in vision, no earache, sore throat or sinus congestion. NECK: No pain or stiffness. CARDIOVASCULAR: No chest pain or pressure. No palpitations. PULMONARY: No shortness of breath, cough or wheeze. GASTROINTESTINAL: No abdominal pain, nausea, vomiting or diarrhea, melena or bright red blood per rectum. GENITOURINARY: No urinary frequency, urgency, hesitancy or dysuria. MUSCULOSKELETAL: No joint or muscle pain, no back pain, no recent trauma. DERMATOLOGIC: No rash, no itching, no lesions. ENDOCRINE: No polyuria, polydipsia, no heat or cold intolerance. No recent change in  weight. HEMATOLOGICAL: No anemia or easy bruising or bleeding. NEUROLOGIC: No headache, seizures, numbness, tingling or weakness. PSYCHIATRIC: No depression, no loss of interest in normal activity or change in sleep pattern.     Exam: chaperone present  BP 118/70   Ht 5\' 8"  (1.727 m)   Wt 211 lb (95.7 kg)   BMI 32.08 kg/m   Body mass index is 32.08 kg/m.  General appearance : Well developed well nourished female. No acute distress HEENT: Eyes: no retinal hemorrhage or exudates,  Neck supple, trachea midline, no carotid bruits, no thyroidmegaly Lungs: Clear to auscultation, no rhonchi or wheezes, or rib retractions  Heart: Regular rate and rhythm, no murmurs or gallops Breast:Examined in sitting and supine position were symmetrical in appearance, no palpable masses or tenderness,  no skin retraction, no nipple inversion, no nipple discharge, no skin discoloration, no axillary or supraclavicular lymphadenopathy Abdomen: no palpable masses or tenderness, no rebound or guarding Extremities: no edema or skin discoloration or tenderness  Pelvic:  Bartholin, Urethra, Skene Glands: Within normal limits             Vagina: No gross lesions or discharge  Cervix: No gross lesions or discharge, IUD string not visualized  Uterus  anteverted, normal size, shape and consistency, non-tender and mobile  Adnexa  Without masses or tenderness  Anus and perineum  normal   Rectovaginal  normal sphincter tone without palpated masses or tenderness  Hemoccult not examined     Assessment/Plan:  46 y.o. female for annual exam with family history of diabetes. Patient also with past history vitamin D deficiency. She is fasting and the following screening blood work will be ordered and drawn today: Comprehensive metabolic panel, fasting lipid profile, TSH, CBC, urinalysis, and vitamin D. Pap smear not indicated this year. Patient return to the office in 1-2 weeks for an ultrasound to confirm that the  IUD has not shifted position since 80 string was not visualized. It is due to be changed in 2019. She was encouraged to do her monthly breast exam and to continue taking vitamin D3 cholecalciferol 2000 units daily.   Ok Edwards MD, 8:22 AM 11/16/2016

## 2016-11-17 ENCOUNTER — Other Ambulatory Visit: Payer: Self-pay | Admitting: Gynecology

## 2016-11-17 DIAGNOSIS — R7989 Other specified abnormal findings of blood chemistry: Secondary | ICD-10-CM

## 2016-11-17 LAB — VITAMIN D 25 HYDROXY (VIT D DEFICIENCY, FRACTURES): Vit D, 25-Hydroxy: 40 ng/mL (ref 30–100)

## 2016-11-17 LAB — URINE CULTURE: Organism ID, Bacteria: NO GROWTH

## 2016-11-30 ENCOUNTER — Other Ambulatory Visit: Payer: Self-pay | Admitting: Gynecology

## 2016-11-30 ENCOUNTER — Ambulatory Visit (INDEPENDENT_AMBULATORY_CARE_PROVIDER_SITE_OTHER): Payer: 59 | Admitting: Gynecology

## 2016-11-30 ENCOUNTER — Ambulatory Visit (HOSPITAL_COMMUNITY): Payer: Self-pay | Admitting: Clinical

## 2016-11-30 ENCOUNTER — Encounter (HOSPITAL_COMMUNITY): Payer: Self-pay | Admitting: Clinical

## 2016-11-30 ENCOUNTER — Ambulatory Visit (INDEPENDENT_AMBULATORY_CARE_PROVIDER_SITE_OTHER): Payer: 59

## 2016-11-30 ENCOUNTER — Encounter: Payer: Self-pay | Admitting: Gynecology

## 2016-11-30 ENCOUNTER — Ambulatory Visit (INDEPENDENT_AMBULATORY_CARE_PROVIDER_SITE_OTHER): Payer: 59 | Admitting: Clinical

## 2016-11-30 VITALS — BP 128/80 | Ht 68.0 in | Wt 211.0 lb

## 2016-11-30 DIAGNOSIS — R232 Flushing: Secondary | ICD-10-CM

## 2016-11-30 DIAGNOSIS — Z30431 Encounter for routine checking of intrauterine contraceptive device: Secondary | ICD-10-CM | POA: Diagnosis not present

## 2016-11-30 DIAGNOSIS — T8332XD Displacement of intrauterine contraceptive device, subsequent encounter: Secondary | ICD-10-CM

## 2016-11-30 DIAGNOSIS — N951 Menopausal and female climacteric states: Secondary | ICD-10-CM | POA: Diagnosis not present

## 2016-11-30 DIAGNOSIS — F4001 Agoraphobia with panic disorder: Secondary | ICD-10-CM

## 2016-11-30 DIAGNOSIS — R7989 Other specified abnormal findings of blood chemistry: Secondary | ICD-10-CM

## 2016-11-30 DIAGNOSIS — N831 Corpus luteum cyst of ovary, unspecified side: Secondary | ICD-10-CM

## 2016-11-30 LAB — CREATININE, SERUM: Creat: 1.21 mg/dL — ABNORMAL HIGH (ref 0.50–1.10)

## 2016-11-30 NOTE — Progress Notes (Signed)
   Patient is a 46 year old that presented to the office today to discuss her ultrasound. At time of her annual exam a few weeks ago the IUD string was not visualized. She was here also discuss her recent screening blood work which included a comprehensive metabolic panel whereby the serum creatinine was slightly elevated at 1.17 and she's here to have the test repeated. Her lipid profile was normal with exception 100 density lipoprotein was slightly below normal at 41 her vitamin D level was normal as was her CBC, TSH and urinalysis.  Patient stated she was complaining of sweating during the night Baker up at night but not during the day. She does does have a Mirena IUD.  Ultrasound: Uterus measured 8.1 x 5.7 x 4.8 cm with endometrial stripe of 2 mm. Right ovary was normal left ovary with a corpus luteum cyst measuring 18 x 14 mm. Negative color flow. Left adnexal paraovarian cyst 10 x 10 mm was noted. No fluid in the cul-de-sac. The IUD was seen in the normal position.  Assessment/plan: Patient was otherwise normal ultrasound with exception small left paraovarian cyst. Patient perimenopausal with some hot flashes in the evening we'll check an FSH level today. Recent serum creatinine level was slightly elevated she was encouraged to increase her fluid and come by the office for serum creatinine level which will be drawn today. Patient otherwise scheduled to return back next year or when necessary. We did provided with literature information on the perimenopause as well as on hormone replacement therapy.  Time of consultation 15 minutes

## 2016-11-30 NOTE — Progress Notes (Signed)
   THERAPIST PROGRESS NOTE  Session Time: 4:37 - 5:15  Participation Level: Active  Behavioral Response: CasualAlertNA  Type of Therapy: Individual Therapy  Treatment Goals addressed: improve psychiatric symptoms, reduce anxiety , improve unhelpful thought patterns, stress management skills (stress reduction and stress tolerance), reduce irrational fears and worries,  reduce anxiety attacks,  implement healthy Coping skills, reduce panic attacks.  Interventions: CBT and Motivational Interviewing,   Summary: Patty Hale is a 46 y.o. female who presents with Panic disorder with agoraphobia and moderate panic attacks.   Suicidal/Homicidal: Nowithout intent/plan  Therapist Response: [name] met with clinician for an individual session. [name] discussed her psychiatric symptoms, her current life events, and homework. [name] shared that she has been having less panic attacks. She shared that she has had some almost panic attacks but has been able to keep them to a minimum. 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 Clinician asked open ended questions and she identified her negative automatic thoughts. Clinician asked open ended questions and she was able to identified the evidence for and against the thoughts. she was then able to formulate healthier alternative thoughts. Client and clinician discussed how her actions might change if her thoughts change. She shared that she would be more active and excited a bout studying. Clinician asked her to be rated her anxiety a bout studying. She rated her anxiety much lower. [name] shared that she has had a panic attack before with taking this test. Clinician asked open ended questions and she identified steps she could take to reduce her anxiety prior reduce her anxiety during. Client and clinician agreed that [name] is as close to graduating from therapy. Client and clinician agreed to one more session.     Plan: Return again in 1-2  weeks.  Diagnosis: Axis I: Panic disorder with agoraphobia and moderate panic attacks                            [name] A, LCSW 11/30/2016

## 2016-11-30 NOTE — Patient Instructions (Signed)
Menopause and Hormone Replacement Therapy Introduction WHAT IS HORMONE REPLACEMENT THERAPY? Hormone replacement therapy (HRT) is the use of artificial (synthetic) hormones to replace hormones that your body stops producing during menopause. Menopause is the normal time of life when menstrual periods stop completely and the ovaries stop producing the female hormones estrogen and progesterone. This lack of hormones can affect your health and cause undesirable symptoms. HRT can relieve some of those symptoms. WHAT ARE MY OPTIONS FOR HRT? HRT may consist of the synthetic hormones estrogen and progestin, or it may consist of only estrogen (estrogen-only therapy). You and your health care provider will decide which form of HRT is best for you. If you choose to be on HRT and you have a uterus, estrogen and progestin are usually prescribed. Estrogen-only therapy is used for women who do not have a uterus. Possible options for taking HRT include:  Pills.  Patches.  Gels.  Sprays.  Vaginal cream.  Vaginal rings.  Vaginal inserts. The amount of hormone(s) that you take and how long you take the hormone(s) varies depending on your individual health. It is important to:  Begin HRT with the lowest possible dosage.  Stop HRT as soon as your health care provider tells you to stop.  Work with your health care provider so that you feel informed and comfortable with your decisions. WHAT ARE THE BENEFITS OF HRT? HRT can reduce the frequency and severity of menopausal symptoms. Benefits of HRT vary depending on the menopausal symptoms that you have, the severity of your symptoms, and your overall health. HRT may help to improve the following menopausal symptoms:  Hot flashes and night sweats. These are sudden feelings of heat that spread over the face and body. The skin may turn red, like a blush. Night sweats are hot flashes that happen while you are sleeping or trying to sleep.  Bone loss  (osteoporosis). The body loses calcium more quickly after menopause, causing the bones to become weaker. This can increase the risk for bone breaks (fractures).  Vaginal dryness. The lining of the vagina can become thin and dry, which can cause pain during sexual intercourse or cause infection, burning, or itching.  Urinary tract infections.  Urinary incontinence. This is a decreased ability to control when you urinate.  Irritability.  Short-term memory problems. WHAT ARE THE RISKS OF HRT? Risks of HRT vary depending on your individual health and medical history. Risks of HRT also depend on whether you receive both estrogen and progestin or you receive estrogen only.HRT may increase the risk of:  Spotting. This is when a small amount of bloodleaks from the vagina unexpectedly.  Endometrial cancer. This cancer is in the lining of the uterus (endometrium).  Breast cancer.  Increased density of breast tissue. This can make it harder to find breast cancer on a breast X-ray (mammogram).  Stroke.  Heart attack.  Blood clots.  Gallbladder disease. Risks of HRT can increase if you have any of the following conditions:  Endometrial cancer.  Liver disease.  Heart disease.  Breast cancer.  History of blood clots.  History of stroke. HOW SHOULD I CARE FOR MYSELF WHILE I AM ON HRT?  Take over-the-counter and prescription medicines only as told by your health care provider.  Get mammograms, pelvic exams, and medical checkups as often as told by your health care provider.  Have Pap tests done as often as told by your health care provider. A Pap test is sometimes called a Pap smear. It is a  screening test that is used to check for signs of cancer of the cervix and vagina. A Pap test can also identify the presence of infection or precancerous changes. Pap tests may be done:  Every 3 years, starting at age 45.  Every 5 years, starting after age 53, in combination with testing for  human papillomavirus (HPV).  More often or less often depending on other medical conditions you have, your age, and other risk factors.  It is your responsibility to get your Pap test results. Ask your health care provider or the department performing the test when your results will be ready.  Keep all follow-up visits as told by your health care provider. This is important. WHEN SHOULD I SEEK MEDICAL CARE? Talk with your health care provider if:  You have any of these:  Pain or swelling in your legs.  Shortness of breath.  Chest pain.  Lumps or changes in your breasts or armpits.  Slurred speech.  Pain, burning, or bleeding when you urine.  You develop any of these:  Unusual vaginal bleeding.  Dizziness or headaches.  Weakness or numbness in any part of your arms or legs.  Pain in your abdomen. This information is not intended to replace advice given to you by your health care provider. Make sure you discuss any questions you have with your health care provider. Document Released: 07/04/2003 Document Revised: 03/12/2016 Document Reviewed: 04/08/2015  2017 Elsevier Perimenopause Perimenopause is the time when your body begins to move into the menopause (no menstrual period for 12 straight months). It is a natural process. Perimenopause can begin 2-8 years before the menopause and usually lasts for 1 year after the menopause. During this time, your ovaries may or may not produce an egg. The ovaries vary in their production of estrogen and progesterone hormones each month. This can cause irregular menstrual periods, difficulty getting pregnant, vaginal bleeding between periods, and uncomfortable symptoms. CAUSES  Irregular production of the ovarian hormones, estrogen and progesterone, and not ovulating every month.  Other causes include:  Tumor of the pituitary gland in the brain.  Medical disease that affects the ovaries.  Radiation  treatment.  Chemotherapy.  Unknown causes.  Heavy smoking and excessive alcohol intake can bring on perimenopause sooner. SIGNS AND SYMPTOMS   Hot flashes.  Night sweats.  Irregular menstrual periods.  Decreased sex drive.  Vaginal dryness.  Headaches.  Mood swings.  Depression.  Memory problems.  Irritability.  Tiredness.  Weight gain.  Trouble getting pregnant.  The beginning of losing bone cells (osteoporosis).  The beginning of hardening of the arteries (atherosclerosis). DIAGNOSIS  Your health care provider will make a diagnosis by analyzing your age, menstrual history, and symptoms. He or she will do a physical exam and note any changes in your body, especially your female organs. Female hormone tests may or may not be helpful depending on the amount of female hormones you produce and when you produce them. However, other hormone tests may be helpful to rule out other problems. TREATMENT  In some cases, no treatment is needed. The decision on whether treatment is necessary during the perimenopause should be made by you and your health care provider based on how the symptoms are affecting you and your lifestyle. Various treatments are available, such as:  Treating individual symptoms with a specific medicine for that symptom.  Herbal medicines that can help specific symptoms.  Counseling.  Group therapy. HOME CARE INSTRUCTIONS   Keep track of your menstrual periods (when  they occur, how heavy they are, how long between periods, and how long they last) as well as your symptoms and when they started.  Only take over-the-counter or prescription medicines as directed by your health care provider.  Sleep and rest.  Exercise.  Eat a diet that contains calcium (good for your bones) and soy (acts like the estrogen hormone).  Do not smoke.  Avoid alcoholic beverages.  Take vitamin supplements as recommended by your health care provider. Taking vitamin E  may help in certain cases.  Take calcium and vitamin D supplements to help prevent bone loss.  Group therapy is sometimes helpful.  Acupuncture may help in some cases. SEEK MEDICAL CARE IF:   You have questions about any symptoms you are having.  You need a referral to a specialist (gynecologist, psychiatrist, or psychologist). SEEK IMMEDIATE MEDICAL CARE IF:   You have vaginal bleeding.  Your period lasts longer than 8 days.  Your periods are recurring sooner than 21 days.  You have bleeding after intercourse.  You have severe depression.  You have pain when you urinate.  You have severe headaches.  You have vision problems. This information is not intended to replace advice given to you by your health care provider. Make sure you discuss any questions you have with your health care provider. Document Released: 11/12/2004 Document Revised: 10/26/2014 Document Reviewed: 05/04/2013 Elsevier Interactive Patient Education  2017 [employer].

## 2016-12-01 ENCOUNTER — Telehealth: Payer: Self-pay | Admitting: *Deleted

## 2016-12-01 DIAGNOSIS — M19012 Primary osteoarthritis, left shoulder: Secondary | ICD-10-CM | POA: Diagnosis not present

## 2016-12-01 DIAGNOSIS — R7989 Other specified abnormal findings of blood chemistry: Secondary | ICD-10-CM

## 2016-12-01 LAB — FOLLICLE STIMULATING HORMONE: FSH: 33.2 m[IU]/mL

## 2016-12-01 NOTE — Telephone Encounter (Signed)
-----   Message from Keenan Bachelor, [place] sent at 12/01/2016  9:52 AM EST ----- Regarding: referral [facility] int med Per Dr. Glenetta Hew "Also would like to make an appointment for her with [facility]  internal medicine because of her continued elevation of her serum creatinine (this will need further evaluation) ."  Patient informed. Thanks!

## 2016-12-01 NOTE — Telephone Encounter (Signed)
Called [facility] [place] spoke with Nelva Bush she asked if the patient will call and schedule new patient appointment. Norma # to calla nd schedule is [phone] ext:[phone], I left message for pt to call to relay #

## 2016-12-03 ENCOUNTER — Encounter: Payer: Self-pay | Admitting: *Deleted

## 2016-12-07 ENCOUNTER — Ambulatory Visit
Admission: RE | Admit: 2016-12-07 | Discharge: 2016-12-07 | Disposition: A | Discharge status: Home / Self Care | Payer: 59 | Source: Ambulatory Visit | Attending: Gynecology | Admitting: Gynecology

## 2016-12-07 DIAGNOSIS — Z1231 Encounter for screening mammogram for malignant neoplasm of breast: Secondary | ICD-10-CM

## 2016-12-21 ENCOUNTER — Ambulatory Visit (HOSPITAL_COMMUNITY): Payer: Self-pay | Admitting: Clinical

## 2016-12-24 ENCOUNTER — Ambulatory Visit (HOSPITAL_COMMUNITY): Payer: Self-pay | Admitting: Clinical

## 2017-01-11 NOTE — Addendum Note (Signed)
Addended by: Aura Camps on: 01/11/2017 12:32 PM   Modules accepted: Orders

## 2017-01-14 DIAGNOSIS — R899 Unspecified abnormal finding in specimens from other organs, systems and tissues: Secondary | ICD-10-CM | POA: Diagnosis not present

## 2017-01-19 ENCOUNTER — Ambulatory Visit (HOSPITAL_COMMUNITY): Payer: Self-pay | Admitting: Clinical

## 2017-02-15 DIAGNOSIS — M170 Bilateral primary osteoarthritis of knee: Secondary | ICD-10-CM | POA: Diagnosis not present

## 2017-02-15 DIAGNOSIS — M545 Low back pain: Secondary | ICD-10-CM | POA: Diagnosis not present

## 2017-02-15 DIAGNOSIS — M25551 Pain in right hip: Secondary | ICD-10-CM | POA: Diagnosis not present

## 2017-02-18 ENCOUNTER — Ambulatory Visit (HOSPITAL_COMMUNITY): Payer: Self-pay | Admitting: Clinical

## 2017-03-02 ENCOUNTER — Encounter (HOSPITAL_COMMUNITY): Payer: Self-pay | Admitting: Clinical

## 2017-03-02 ENCOUNTER — Ambulatory Visit (INDEPENDENT_AMBULATORY_CARE_PROVIDER_SITE_OTHER): Payer: 59 | Admitting: Clinical

## 2017-03-02 DIAGNOSIS — F410 Panic disorder [episodic paroxysmal anxiety] without agoraphobia: Secondary | ICD-10-CM | POA: Diagnosis not present

## 2017-03-02 DIAGNOSIS — F411 Generalized anxiety disorder: Secondary | ICD-10-CM

## 2017-03-02 NOTE — Progress Notes (Signed)
   THERAPIST PROGRESS NOTE  Session Time: 12:00 - 12:55  Participation Level: Active  Behavioral Response: NeatAlertAnxious  Type of Therapy: Individual Therapy  Treatment Goals addressed: improve psychiatric symptoms, reduce anxiety, improve unhelpful thought patterns, stress management skills, reduce irrational fears and worries,   learn about diagnosis, implement healthy Coping skills, reduce panic attacks.  Interventions: CBT and Motivational Interviewing, psycho education, grounding and mindfulness techniques   Summary: Patty Hale is a 46 y.o. female who presents with generalized anxiety disorder and panic attacks  Suicidal/Homicidal: Nowithout intent/plan  Therapist Response: Ravan met with clinician for an individual session. Madyn discussed her psychiatric symptoms, her current life events. Client and clinician worked together to review and update her assessment. Clinician asked open ended questions and Denez shared about her anxiety and some panic attacks she experienced. Clinician asked open ended questions about and Kirti shared about her panic attacks. She identified the events that happened before the panic attacks. She shared about her negative thought patterns. She was then able to identify the evidence for and against the thoughts. She was then able to formulate healthier alternative thoughts. Client and clinician discussed the nature of panic attacks and anxiety. Client and clinician discussed healthy coping skills. Client and clinician reviewed and discussed grounding and mindfulness techniques.  Plan: Return again in 1-2 weeks.  Diagnosis: Axis I: generalized anxiety disorder and panic attacks                      Arnie Clingenpeel A, LCSW 03/02/2017

## 2017-03-03 ENCOUNTER — Encounter: Payer: Self-pay | Admitting: Gynecology

## 2017-03-03 NOTE — Progress Notes (Signed)
Comprehensive Clinical Assessment (CCA) Note  03/03/2017 TAJAE JACEK [id number]  Visit Diagnosis:      ICD-9-CM ICD-10-CM   1. Generalized anxiety disorder 300.02 F41.1   2. Panic attacks 300.01 F41.0       CCA Part One  Part One has been completed on paper by the patient.  (See scanned document in Chart Review)  CCA Part Two A  Intake/Chief Complaint:  CCA Intake With Chief Complaint CCA Part Two Date: 03/02/17 CCA Part Two Time: 1203 Chief Complaint/Presenting Problem: Panic attack - Anxiety  Patients Currently Reported Symptoms/Problems: stress -  Individual's Strengths: (P) "Helping poeple" Individual's Preferences: (P) "I want to a sense of calmness I can feel when I do stuff." Type of Services Patient Feels Are Needed: Individual Therapy  Mental Health Symptoms Depression:  Depression: N/A  Mania:  Mania: N/A  Anxiety:   Anxiety: Tension, Worrying, Difficulty concentrating (panic attack 1-2 times a month - more likely in novel situations)  Psychosis:  Psychosis: N/A  Trauma:  Trauma: N/A  Obsessions:  Obsessions: N/A  Compulsions:  Compulsions: N/A  Inattention:  Inattention: N/A  Hyperactivity/Impulsivity:  Hyperactivity/Impulsivity: N/A  Oppositional/Defiant Behaviors:  Oppositional/Defiant Behaviors: N/A  Borderline Personality:  Emotional Irregularity: N/A  Other Mood/Personality Symptoms:      Mental Status Exam Appearance and self-care  Stature:  Stature: Average  Weight:  Weight: Average weight  Clothing:  Clothing: Casual  Grooming:  Grooming: Normal  Cosmetic use:  Cosmetic Use: Age appropriate  Posture/gait:  Posture/Gait: Normal  Motor activity:  Motor Activity: Not Remarkable  Sensorium  Attention:  Attention: Normal  Concentration:  Concentration: Normal  Orientation:  Orientation: X5  Recall/memory:  Recall/Memory: Normal  Affect and Mood  Affect:  Affect: Appropriate  Mood:  Mood: Anxious  Relating  Eye contact:  Eye Contact: Normal   Facial expression:  Facial Expression: Responsive  Attitude toward examiner:  Attitude Toward Examiner: Cooperative  Thought and Language  Speech flow: Speech Flow: Normal  Thought content:  Thought Content: Appropriate to mood and circumstances  Preoccupation:  Preoccupations: Other (Comment) (anxiety )  Hallucinations:     Organization:     Company secretary of Knowledge:  Fund of Knowledge: Average  Intelligence:  Intelligence: Average  Abstraction:  Abstraction: Normal  Judgement:  Judgement: Normal  Reality Testing:  Reality Testing: Realistic  Insight:  Insight: Fair  Decision Making:  Decision Making: Normal  Social Functioning  Social Maturity:  Social Maturity: Responsible  Social Judgement:  Social Judgement: Normal  Stress  Stressors:  Stressors: Arts administrator, Work, Transitions  Coping Ability:  Coping Ability: Normal  Skill Deficits:     Supports:      Family and Psychosocial History: Family history Are you sexually active?: Yes What is your sexual orientation?: Heterosexual  Has your sexual activity been affected by drugs, alcohol, medication, or emotional stress?: I am doing okay Does patient have children?: Yes How many children?: 3 How is patient's relationship with their children?: 30  - step Whitney, 23 Staci, 20 Earl    - I get a long great with children  Childhood History:  Childhood History By whom was/is the patient raised?: Both parents Additional childhood history information: Growing up was good. I was oldest. We ate dinner together. I was in track and in the band. Parents were very supportive.  Description of patient's relationship with caregiver when they were a child: Got a long good with parents when growing up Patient's description of current relationship with  people who raised him/her: Mother - great - Daddy - great How were you disciplined when you got in trouble as a child/adolescent?: sometimes i got a whooping, When I got older stand in  the corner or no tv Does patient have siblings?: Yes Number of Siblings: 2 Description of patient's current relationship with siblings: Duwayne Heck - get along great - Daniel - Get along great Did patient suffer any verbal/emotional/physical/sexual abuse as a child?: No Has patient ever been sexually abused/assaulted/raped as an adolescent or adult?: No Was the patient ever a victim of a crime or a disaster?: No Witnessed domestic violence?: No Has patient been effected by domestic violence as an adult?: No  CCA Part Two B  Employment/Work Situation: Employment / Work Psychologist, occupational Employment situation: Employed 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[employer] 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 Patient's job has been impacted by current illness: No What is the longest time patient has a held a job?: 24 years Where was the patient employed at that time?: Armed forces operational officer for [employer]  Has patient ever been in the [employer]?: No Are There Guns or Other Weapons in Your Home?: No  Education: Education Name of [employer] School: [employer]  Did Garment/textile technologist From [employer]?: Yes Did Theme park manager?: Yes What Type of College Degree Do you Have?: BS in Accounting  Did You Attend Graduate School?: No Did You Have An Individualized Education Program (IIEP): No Did You Have Any Difficulty At [employer]?: No  Religion: Religion/Spirituality Are You A Religious Person?: Yes What is Your Religious Affiliation?: Baptist How Might This Affect Treatment?: None   Leisure/Recreation: Leisure / Recreation Leisure and Hobbies: "I don't know now. I like to walk around the park and just hang out, I like to play tennis, do family stuff."  Exercise/Diet: Exercise/Diet Do You Exercise?: No Have You Gained or Lost A Significant Amount of Weight in the Past Six Months?: Yes-Gained Number of Pounds Gained: 20 Do You Follow a Special Diet?: No Do You Have Any  Trouble Sleeping?: Yes Explanation of Sleeping Difficulties: waking up after I sleep.   CCA Part Two C  Alcohol/Drug Use: Alcohol / Drug Use History of alcohol / drug use?: No history of alcohol / drug abuse                      CCA Part Three  ASAM's:  Six Dimensions of Multidimensional Assessment  Dimension 1:  Acute Intoxication and/or Withdrawal Potential:     Dimension 2:  Biomedical Conditions and Complications:     Dimension 3:  Emotional, Behavioral, or Cognitive Conditions and Complications:     Dimension 4:  Readiness to Change:     Dimension 5:  Relapse, Continued use, or Continued Problem Potential:     Dimension 6:  Recovery/Living Environment:      Substance use Disorder (SUD)    Social Function:  Social Functioning Social Maturity: Responsible Social Judgement: Normal  Stress:  Stress Stressors: Arts administrator, Work, Transitions Coping Ability: Normal Patient Takes Medications The Way The Doctor Instructed?: Yes Priority Risk: Low Acuity  Risk Assessment- Self-Harm Potential: Risk Assessment For Self-Harm Potential Thoughts of Self-Harm: No current thoughts Method: No plan Availability of Means: No access/NA  Risk Assessment -Dangerous to Others Potential: Risk Assessment For Dangerous to Others Potential Method: No Plan Availability of Means: No access or NA Intent: Vague intent or NA Notification Required: No need or identified person  DSM5  Diagnoses: Patient Active Problem List   Diagnosis Date Noted  . Vitamin D deficiency 11/16/2016  . Radial scar of breast 03/21/2015  . Dense breast tissue 03/21/2015  . IUD (intrauterine device) in place 10/11/2013  . Mastodynia 01/25/2013  . Hx gestational diabetes 10/05/2011    Patient Centered Plan: Patient is on the following Treatment Plan(s): treatment plan on file  Individual therapy 1x every 3-4 weeks, sessions to become less frequent as symptoms improve  Recommendations for  Services/Supports/Treatments: Recommendations for Services/Supports/Treatments Recommendations For Services/Supports/Treatments: Individual Therapy, Medication Management  Treatment Plan Summary:    Referrals to Alternative Service(s): Referred to Alternative Service(s):   Place:   Date:   Time:    Referred to Alternative Service(s):   Place:   Date:   Time:    Referred to Alternative Service(s):   Place:   Date:   Time:    Referred to Alternative Service(s):   Place:   Date:   Time:     Shahzad Thomann A

## 2017-03-26 DIAGNOSIS — I8391 Asymptomatic varicose veins of right lower extremity: Secondary | ICD-10-CM | POA: Diagnosis not present

## 2017-03-29 DIAGNOSIS — M170 Bilateral primary osteoarthritis of knee: Secondary | ICD-10-CM | POA: Diagnosis not present

## 2017-03-29 DIAGNOSIS — M25551 Pain in right hip: Secondary | ICD-10-CM | POA: Diagnosis not present

## 2017-04-12 ENCOUNTER — Ambulatory Visit (HOSPITAL_COMMUNITY): Payer: Self-pay | Admitting: Clinical

## 2017-05-05 DIAGNOSIS — M25561 Pain in right knee: Secondary | ICD-10-CM | POA: Diagnosis not present

## 2017-05-07 DIAGNOSIS — M25561 Pain in right knee: Secondary | ICD-10-CM | POA: Diagnosis not present

## 2017-05-07 DIAGNOSIS — M222X1 Patellofemoral disorders, right knee: Secondary | ICD-10-CM | POA: Diagnosis not present

## 2017-05-11 DIAGNOSIS — M25561 Pain in right knee: Secondary | ICD-10-CM | POA: Diagnosis not present

## 2017-05-11 DIAGNOSIS — M222X1 Patellofemoral disorders, right knee: Secondary | ICD-10-CM | POA: Diagnosis not present

## 2017-05-13 DIAGNOSIS — M25561 Pain in right knee: Secondary | ICD-10-CM | POA: Diagnosis not present

## 2017-05-13 DIAGNOSIS — M222X1 Patellofemoral disorders, right knee: Secondary | ICD-10-CM | POA: Diagnosis not present

## 2017-05-18 DIAGNOSIS — M25561 Pain in right knee: Secondary | ICD-10-CM | POA: Diagnosis not present

## 2017-05-18 DIAGNOSIS — M222X1 Patellofemoral disorders, right knee: Secondary | ICD-10-CM | POA: Diagnosis not present

## 2017-05-20 DIAGNOSIS — M25561 Pain in right knee: Secondary | ICD-10-CM | POA: Diagnosis not present

## 2017-05-20 DIAGNOSIS — M222X1 Patellofemoral disorders, right knee: Secondary | ICD-10-CM | POA: Diagnosis not present

## 2017-05-25 DIAGNOSIS — M25561 Pain in right knee: Secondary | ICD-10-CM | POA: Diagnosis not present

## 2017-05-25 DIAGNOSIS — M222X1 Patellofemoral disorders, right knee: Secondary | ICD-10-CM | POA: Diagnosis not present

## 2017-05-27 DIAGNOSIS — M222X1 Patellofemoral disorders, right knee: Secondary | ICD-10-CM | POA: Diagnosis not present

## 2017-05-27 DIAGNOSIS — M25561 Pain in right knee: Secondary | ICD-10-CM | POA: Diagnosis not present

## 2017-06-01 DIAGNOSIS — M25561 Pain in right knee: Secondary | ICD-10-CM | POA: Diagnosis not present

## 2017-06-01 DIAGNOSIS — M222X1 Patellofemoral disorders, right knee: Secondary | ICD-10-CM | POA: Diagnosis not present

## 2017-06-03 DIAGNOSIS — M222X1 Patellofemoral disorders, right knee: Secondary | ICD-10-CM | POA: Diagnosis not present

## 2017-06-03 DIAGNOSIS — M25561 Pain in right knee: Secondary | ICD-10-CM | POA: Diagnosis not present

## 2017-06-08 DIAGNOSIS — M25561 Pain in right knee: Secondary | ICD-10-CM | POA: Diagnosis not present

## 2017-06-08 DIAGNOSIS — M222X1 Patellofemoral disorders, right knee: Secondary | ICD-10-CM | POA: Diagnosis not present

## 2017-06-10 DIAGNOSIS — M25561 Pain in right knee: Secondary | ICD-10-CM | POA: Diagnosis not present

## 2017-06-10 DIAGNOSIS — M222X1 Patellofemoral disorders, right knee: Secondary | ICD-10-CM | POA: Diagnosis not present

## 2017-06-18 DIAGNOSIS — M25561 Pain in right knee: Secondary | ICD-10-CM | POA: Diagnosis not present

## 2017-06-18 DIAGNOSIS — M222X1 Patellofemoral disorders, right knee: Secondary | ICD-10-CM | POA: Diagnosis not present

## 2017-06-23 ENCOUNTER — Other Ambulatory Visit: Payer: Self-pay | Admitting: Orthopedic Surgery

## 2017-06-23 DIAGNOSIS — M25561 Pain in right knee: Secondary | ICD-10-CM | POA: Diagnosis not present

## 2017-06-23 DIAGNOSIS — M222X1 Patellofemoral disorders, right knee: Secondary | ICD-10-CM | POA: Diagnosis not present

## 2017-07-02 ENCOUNTER — Other Ambulatory Visit: Payer: Self-pay

## 2017-07-08 ENCOUNTER — Ambulatory Visit
Admission: RE | Admit: 2017-07-08 | Discharge: 2017-07-08 | Disposition: A | Discharge status: Home / Self Care | Payer: 59 | Source: Ambulatory Visit | Attending: Orthopedic Surgery | Admitting: Orthopedic Surgery

## 2017-07-08 DIAGNOSIS — M25561 Pain in right knee: Secondary | ICD-10-CM

## 2017-07-12 DIAGNOSIS — S83241A Other tear of medial meniscus, current injury, right knee, initial encounter: Secondary | ICD-10-CM | POA: Diagnosis not present

## 2017-07-12 DIAGNOSIS — S83281A Other tear of lateral meniscus, current injury, right knee, initial encounter: Secondary | ICD-10-CM | POA: Diagnosis not present

## 2017-07-16 DIAGNOSIS — H524 Presbyopia: Secondary | ICD-10-CM | POA: Diagnosis not present

## 2017-07-16 DIAGNOSIS — H43392 Other vitreous opacities, left eye: Secondary | ICD-10-CM | POA: Diagnosis not present

## 2017-08-18 DIAGNOSIS — S83281D Other tear of lateral meniscus, current injury, right knee, subsequent encounter: Secondary | ICD-10-CM | POA: Diagnosis not present

## 2017-08-18 DIAGNOSIS — S83241D Other tear of medial meniscus, current injury, right knee, subsequent encounter: Secondary | ICD-10-CM | POA: Diagnosis not present

## 2017-08-24 DIAGNOSIS — S83241D Other tear of medial meniscus, current injury, right knee, subsequent encounter: Secondary | ICD-10-CM | POA: Diagnosis not present

## 2017-08-24 DIAGNOSIS — S83281D Other tear of lateral meniscus, current injury, right knee, subsequent encounter: Secondary | ICD-10-CM | POA: Diagnosis not present

## 2017-10-19 HISTORY — PX: KNEE SURGERY: SHX244

## 2017-10-25 ENCOUNTER — Other Ambulatory Visit: Payer: Self-pay | Admitting: Obstetrics & Gynecology

## 2017-10-25 DIAGNOSIS — Z1231 Encounter for screening mammogram for malignant neoplasm of breast: Secondary | ICD-10-CM

## 2017-11-10 DIAGNOSIS — S83281A Other tear of lateral meniscus, current injury, right knee, initial encounter: Secondary | ICD-10-CM | POA: Diagnosis not present

## 2017-11-10 DIAGNOSIS — S83241A Other tear of medial meniscus, current injury, right knee, initial encounter: Secondary | ICD-10-CM | POA: Diagnosis not present

## 2017-11-10 DIAGNOSIS — G8918 Other acute postprocedural pain: Secondary | ICD-10-CM | POA: Diagnosis not present

## 2017-11-10 DIAGNOSIS — M659 Synovitis and tenosynovitis, unspecified: Secondary | ICD-10-CM | POA: Diagnosis not present

## 2017-11-15 DIAGNOSIS — S83241D Other tear of medial meniscus, current injury, right knee, subsequent encounter: Secondary | ICD-10-CM | POA: Diagnosis not present

## 2017-11-15 DIAGNOSIS — M25561 Pain in right knee: Secondary | ICD-10-CM | POA: Diagnosis not present

## 2017-11-15 DIAGNOSIS — S83281D Other tear of lateral meniscus, current injury, right knee, subsequent encounter: Secondary | ICD-10-CM | POA: Diagnosis not present

## 2017-11-17 DIAGNOSIS — S83281D Other tear of lateral meniscus, current injury, right knee, subsequent encounter: Secondary | ICD-10-CM | POA: Diagnosis not present

## 2017-11-17 DIAGNOSIS — M25561 Pain in right knee: Secondary | ICD-10-CM | POA: Diagnosis not present

## 2017-11-17 DIAGNOSIS — S83241D Other tear of medial meniscus, current injury, right knee, subsequent encounter: Secondary | ICD-10-CM | POA: Diagnosis not present

## 2017-11-19 ENCOUNTER — Ambulatory Visit (INDEPENDENT_AMBULATORY_CARE_PROVIDER_SITE_OTHER): Payer: 59 | Admitting: Obstetrics & Gynecology

## 2017-11-19 ENCOUNTER — Encounter: Payer: Self-pay | Admitting: Obstetrics & Gynecology

## 2017-11-19 VITALS — BP 126/84 | Ht 67.0 in | Wt 202.0 lb

## 2017-11-19 DIAGNOSIS — Z975 Presence of (intrauterine) contraceptive device: Secondary | ICD-10-CM | POA: Diagnosis not present

## 2017-11-19 DIAGNOSIS — Z6831 Body mass index (BMI) 31.0-31.9, adult: Secondary | ICD-10-CM | POA: Diagnosis not present

## 2017-11-19 DIAGNOSIS — Z01419 Encounter for gynecological examination (general) (routine) without abnormal findings: Secondary | ICD-10-CM

## 2017-11-19 DIAGNOSIS — M25561 Pain in right knee: Secondary | ICD-10-CM | POA: Diagnosis not present

## 2017-11-19 DIAGNOSIS — S83241D Other tear of medial meniscus, current injury, right knee, subsequent encounter: Secondary | ICD-10-CM | POA: Diagnosis not present

## 2017-11-19 DIAGNOSIS — S83281D Other tear of lateral meniscus, current injury, right knee, subsequent encounter: Secondary | ICD-10-CM | POA: Diagnosis not present

## 2017-11-19 DIAGNOSIS — E6609 Other obesity due to excess calories: Secondary | ICD-10-CM

## 2017-11-19 DIAGNOSIS — Z1151 Encounter for screening for human papillomavirus (HPV): Secondary | ICD-10-CM | POA: Diagnosis not present

## 2017-11-19 DIAGNOSIS — N951 Menopausal and female climacteric states: Secondary | ICD-10-CM | POA: Diagnosis not present

## 2017-11-19 DIAGNOSIS — R87610 Atypical squamous cells of undetermined significance on cytologic smear of cervix (ASC-US): Secondary | ICD-10-CM | POA: Diagnosis not present

## 2017-11-19 NOTE — Progress Notes (Signed)
  Patty Hale 09/01/1971 [id number]   History:    46 y.o. G2P2L2 Married  RP:  Established patient presenting for annual gyn exam   HPI: Mirena IUD.  Hot flushes.  Last year's FSH February 2018 was at 33.2.  Some hot flashes and night sweats.  No pain with intercourse.  No pelvic pain.  Breasts normal.  Body mass index 31.64.  Recommend increased physical activity.  Health labs done here last year and will repeat this year.  Creatinine was mildly elevated.  Past medical history,surgical history, family history and social history were all reviewed and documented in the EPIC chart.  Gynecologic History No LMP recorded. Patient is not currently having periods (Reason: IUD). Contraception: Mirena IUDx 05/2013 Last Pap: 10/2014. Results were: Negative Last mammogram: 11/2016. Results were: Benign Bone Density: Never Colonoscopy: Never  Obstetric History OB History  Gravida Para Term Preterm AB Living  2 2 2     2  SAB TAB Ectopic Multiple Live Births          2    # Outcome Date GA Lbr Len/2nd Weight Sex Delivery Anes PTL Lv  2 Term     M Vag-Spont  N LIV  1 Term     F Vag-Spont  N LIV       ROS: A ROS was performed and pertinent positives and negatives are included in the history.  GENERAL: No fevers or chills. HEENT: No change in vision, no earache, sore throat or sinus congestion. NECK: No pain or stiffness. CARDIOVASCULAR: No chest pain or pressure. No palpitations. PULMONARY: No shortness of breath, cough or wheeze. GASTROINTESTINAL: No abdominal pain, nausea, vomiting or diarrhea, melena or bright red blood per rectum. GENITOURINARY: No urinary frequency, urgency, hesitancy or dysuria. MUSCULOSKELETAL: No joint or muscle pain, no back pain, no recent trauma. DERMATOLOGIC: No rash, no itching, no lesions. ENDOCRINE: No polyuria, polydipsia, no heat or cold intolerance. No recent change in weight. HEMATOLOGICAL: No anemia or easy bruising or bleeding. NEUROLOGIC: No headache,  seizures, numbness, tingling or weakness. PSYCHIATRIC: No depression, no loss of interest in normal activity or change in sleep pattern.     Exam:   BP 126/84   Ht 5' 7" (1.702 m)   Wt 202 lb (91.6 kg)   BMI 31.64 kg/m   Body mass index is 31.64 kg/m.  General appearance : Well developed well nourished female. No acute distress HEENT: Eyes: no retinal hemorrhage or exudates,  Neck supple, trachea midline, no carotid bruits, no thyroidmegaly Lungs: Clear to auscultation, no rhonchi or wheezes, or rib retractions  Heart: Regular rate and rhythm, no murmurs or gallops Breast:Examined in sitting and supine position were symmetrical in appearance, no palpable masses or tenderness,  no skin retraction, no nipple inversion, no nipple discharge, no skin discoloration, no axillary or supraclavicular lymphadenopathy Abdomen: no palpable masses or tenderness, no rebound or guarding Extremities: no edema or skin discoloration or tenderness  Pelvic: Vulva: Normal             Vagina: No gross lesions or discharge  Cervix: No gross lesions or discharge.  IUD string visible.  Pap/HR HPV done.  Uterus  AV, normal size, shape and consistency, non-tender and mobile  Adnexa  Without masses or tenderness  Anus: Normal   Assessment/Plan:  46 y.o. female for annual exam   1. Encounter for routine gynecological examination with Papanicolaou smear of cervix Normal gynecologic exam.  Pap  Pap test with high risk HPV   done.  Breast exam normal.  Will schedule screening mammogram February 2019.  Regular physical activity recommended.  Screening health labs here today. - CBC - Comp Met (CMET) - Lipid panel - TSH - Vitamin D 1,25 dihydroxy - PAP,TP IMGw/HPV RNA,rflx HPVTYPE16,18/45  2. Hot flushes, perimenopausal Probably in perimenopause.  Will repeat FSH today. - FSH  3. IUD (intrauterine device) in place IUD well-tolerated and in good position.  Will continue with it.  4. Class 1 obesity due to  excess calories without serious comorbidity with body mass index (BMI) of 31.0 to 31.9 in adult Low calorie/low carb diet discussed, for example [place] diet recommended.  Regular aerobic activity 5 times a week and weightbearing physical activity every 2 days recommended.  5. Special screening examination for human papillomavirus (HPV)  - PAP,TP IMGw/HPV RNA,rflx HPVTYPE16,18/45   Marie-Lyne Lavoie MD, 8:45 AM 11/19/2017   

## 2017-11-22 DIAGNOSIS — M25561 Pain in right knee: Secondary | ICD-10-CM | POA: Diagnosis not present

## 2017-11-22 DIAGNOSIS — S83241D Other tear of medial meniscus, current injury, right knee, subsequent encounter: Secondary | ICD-10-CM | POA: Diagnosis not present

## 2017-11-22 DIAGNOSIS — S83281D Other tear of lateral meniscus, current injury, right knee, subsequent encounter: Secondary | ICD-10-CM | POA: Diagnosis not present

## 2017-11-23 ENCOUNTER — Encounter: Payer: Self-pay | Admitting: Obstetrics & Gynecology

## 2017-11-23 LAB — COMPREHENSIVE METABOLIC PANEL
AG Ratio: 1.6 (calc) (ref 1.0–2.5)
ALT: 18 U/L (ref 6–29)
AST: 18 U/L (ref 10–35)
Albumin: 4 g/dL (ref 3.6–5.1)
Alkaline phosphatase (APISO): 97 U/L (ref 33–115)
BUN/Creatinine Ratio: 11 (calc) (ref 6–22)
BUN: 14 mg/dL (ref 7–25)
CO2: 25 mmol/L (ref 20–32)
Calcium: 9.3 mg/dL (ref 8.6–10.2)
Chloride: 107 mmol/L (ref 98–110)
Creat: 1.27 mg/dL — ABNORMAL HIGH (ref 0.50–1.10)
Globulin: 2.5 g/dL (calc) (ref 1.9–3.7)
Glucose, Bld: 89 mg/dL (ref 65–99)
Potassium: 4.6 mmol/L (ref 3.5–5.3)
Sodium: 140 mmol/L (ref 135–146)
Total Bilirubin: 0.3 mg/dL (ref 0.2–1.2)
Total Protein: 6.5 g/dL (ref 6.1–8.1)

## 2017-11-23 LAB — VITAMIN D 1,25 DIHYDROXY
Vitamin D 1, 25 (OH)2 Total: 29 pg/mL (ref 18–72)
Vitamin D2 1, 25 (OH)2: <8 pg/mL
Vitamin D3 1, 25 (OH)2: 29 pg/mL

## 2017-11-23 LAB — TSH: TSH: 2.45 mIU/L

## 2017-11-23 LAB — CBC
HCT: 39.7 % (ref 35.0–45.0)
Hemoglobin: 12.8 g/dL (ref 11.7–15.5)
MCH: 27.1 pg (ref 27.0–33.0)
MCHC: 32.2 g/dL (ref 32.0–36.0)
MCV: 83.9 fL (ref 80.0–100.0)
MPV: 11.3 fL (ref 7.5–12.5)
Platelets: 218 10*3/uL (ref 140–400)
RBC: 4.73 10*6/uL (ref 3.80–5.10)
RDW: 13.4 % (ref 11.0–15.0)
WBC: 5.1 10*3/uL (ref 3.8–10.8)

## 2017-11-23 LAB — LIPID PANEL
Cholesterol: 164 mg/dL (ref <200)
HDL: 57 mg/dL (ref >50)
LDL Cholesterol (Calc): 92 mg/dL (calc)
Non-HDL Cholesterol (Calc): 107 mg/dL (calc) (ref <130)
Total CHOL/HDL Ratio: 2.9 (calc) (ref <5.0)
Triglycerides: 60 mg/dL (ref <150)

## 2017-11-23 LAB — FOLLICLE STIMULATING HORMONE: FSH: 66.1 m[IU]/mL

## 2017-11-23 NOTE — Patient Instructions (Signed)
1. Encounter for routine gynecological examination with Papanicolaou smear of cervix Normal gynecologic exam.  Pap  Pap test with high risk HPV done.  Breast exam normal.  Will schedule screening mammogram February 2019.  Regular physical activity recommended.  Screening health labs here today. - CBC - Comp Met (CMET) - Lipid panel - TSH - Vitamin D 1,25 dihydroxy - PAP,TP IMGw/HPV RNA,rflx HPVTYPE16,18/45  2. Hot flushes, perimenopausal Probably in perimenopause.  Will repeat [place] today. - FSH  3. IUD (intrauterine device) in place IUD well-tolerated and in good position.  Will continue with it.  4. Class 1 obesity due to excess calories without serious comorbidity with body mass index (BMI) of 31.0 to 31.9 in adult Low calorie/low carb diet discussed, for example [employer] recommended.  Regular aerobic activity 5 times a week and weightbearing physical activity every 2 days recommended.  5. Special screening examination for human papillomavirus (HPV)  - PAP,TP IMGw/HPV RNA,rflx [id number],07/37  Patty Hale, it was a pleasure meeting you today!  I will inform you of your results as soon as they are available.   Health Maintenance for Postmenopausal Women Menopause is a normal process in which your reproductive ability comes to an end. This process happens gradually over a span of months to years, usually between the ages of 69 and 14. Menopause is complete when you have missed 12 consecutive menstrual periods. It is important to talk with your health care provider about some of the most common conditions that affect postmenopausal women, such as heart disease, cancer, and bone loss (osteoporosis). Adopting a healthy lifestyle and getting preventive care can help to promote your health and wellness. Those actions can also lower your chances of developing some of these common conditions. What should I know about menopause? During menopause, you may experience a number of symptoms, such  as:  Moderate-to-severe hot flashes.  Night sweats.  Decrease in sex drive.  Mood swings.  Headaches.  Tiredness.  Irritability.  Memory problems.  Insomnia.  Choosing to treat or not to treat menopausal changes is an individual decision that you make with your health care provider. What should I know about hormone replacement therapy and supplements? Hormone therapy products are effective for treating symptoms that are associated with menopause, such as hot flashes and night sweats. Hormone replacement carries certain risks, especially as you become older. If you are thinking about using estrogen or estrogen with progestin treatments, discuss the benefits and risks with your health care provider. What should I know about heart disease and stroke? Heart disease, heart attack, and stroke become more likely as you age. This may be due, in part, to the hormonal changes that your body experiences during menopause. These can affect how your body processes dietary fats, triglycerides, and cholesterol. Heart attack and stroke are both medical emergencies. There are many things that you can do to help prevent heart disease and stroke:  Have your blood pressure checked at least every 1-2 years. High blood pressure causes heart disease and increases the risk of stroke.  If you are 54-52 years old, ask your health care provider if you should take aspirin to prevent a heart attack or a stroke.  Do not use any tobacco products, including cigarettes, chewing tobacco, or electronic cigarettes. If you need help quitting, ask your health care provider.  It is important to eat a healthy diet and maintain a healthy weight. ? Be sure to include plenty of vegetables, fruits, low-fat dairy products, and lean protein. ?  Avoid eating foods that are high in solid fats, added sugars, or salt (sodium).  Get regular exercise. This is one of the most important things that you can do for your health. ? Try  to exercise for at least 150 minutes each week. The type of exercise that you do should increase your heart rate and make you sweat. This is known as moderate-intensity exercise. ? Try to do strengthening exercises at least twice each week. Do these in addition to the moderate-intensity exercise.  Know your numbers.Ask your health care provider to check your cholesterol and your blood glucose. Continue to have your blood tested as directed by your health care provider.  What should I know about cancer screening? There are several types of cancer. Take the following steps to reduce your risk and to catch any cancer development as early as possible. Breast Cancer  Practice breast self-awareness. ? This means understanding how your breasts normally appear and feel. ? It also means doing regular breast self-exams. Let your health care provider know about any changes, no matter how small.  If you are 33 or older, have a clinician do a breast exam (clinical breast exam or CBE) every year. Depending on your age, family history, and medical history, it may be recommended that you also have a yearly breast X-ray (mammogram).  If you have a family history of breast cancer, talk with your health care provider about genetic screening.  If you are at high risk for breast cancer, talk with your health care provider about having an MRI and a mammogram every year.  Breast cancer (BRCA) gene test is recommended for women who have family members with BRCA-related cancers. Results of the assessment will determine the need for genetic counseling and BRCA1 and for BRCA2 testing. BRCA-related cancers include these types: ? Breast. This occurs in males or females. ? Ovarian. ? Tubal. This may also be called fallopian tube cancer. ? Cancer of the abdominal or pelvic lining (peritoneal cancer). ? Prostate. ? Pancreatic.  Cervical, Uterine, and Ovarian Cancer Your health care provider may recommend that you be  screened regularly for cancer of the pelvic organs. These include your ovaries, uterus, and vagina. This screening involves a pelvic exam, which includes checking for microscopic changes to the surface of your cervix (Pap test).  For women ages 21-65, health care providers may recommend a pelvic exam and a Pap test every three years. For women ages 57-65, they may recommend the Pap test and pelvic exam, combined with testing for human papilloma virus (HPV), every five years. Some types of HPV increase your risk of cervical cancer. Testing for HPV may also be done on women of any age who have unclear Pap test results.  Other health care providers may not recommend any screening for nonpregnant women who are considered low risk for pelvic cancer and have no symptoms. Ask your health care provider if a screening pelvic exam is right for you.  If you have had past treatment for cervical cancer or a condition that could lead to cancer, you need Pap tests and screening for cancer for at least 20 years after your treatment. If Pap tests have been discontinued for you, your risk factors (such as having a new sexual partner) need to be reassessed to determine if you should start having screenings again. Some women have medical problems that increase the chance of getting cervical cancer. In these cases, your health care provider may recommend that you have screening and Pap  tests more often.  If you have a family history of uterine cancer or ovarian cancer, talk with your health care provider about genetic screening.  If you have vaginal bleeding after reaching menopause, tell your health care provider.  There are currently no reliable tests available to screen for ovarian cancer.  Lung Cancer Lung cancer screening is recommended for adults 60-54 years old who are at high risk for lung cancer because of a history of smoking. A yearly low-dose CT scan of the lungs is recommended if you:  Currently  smoke.  Have a history of at least 30 pack-years of smoking and you currently smoke or have quit within the past 15 years. A pack-year is smoking an average of one pack of cigarettes per day for one year.  Yearly screening should:  Continue until it has been 15 years since you quit.  Stop if you develop a health problem that would prevent you from having lung cancer treatment.  Colorectal Cancer  This type of cancer can be detected and can often be prevented.  Routine colorectal cancer screening usually begins at age 37 and continues through age 35.  If you have risk factors for colon cancer, your health care provider may recommend that you be screened at an earlier age.  If you have a family history of colorectal cancer, talk with your health care provider about genetic screening.  Your health care provider may also recommend using home test kits to check for hidden blood in your stool.  A small camera at the end of a tube can be used to examine your colon directly (sigmoidoscopy or colonoscopy). This is done to check for the earliest forms of colorectal cancer.  Direct examination of the colon should be repeated every 5-10 years until age 56. However, if early forms of precancerous polyps or small growths are found or if you have a family history or genetic risk for colorectal cancer, you may need to be screened more often.  Skin Cancer  Check your skin from head to toe regularly.  Monitor any moles. Be sure to tell your health care provider: ? About any new moles or changes in moles, especially if there is a change in a mole's shape or color. ? If you have a mole that is larger than the size of a pencil eraser.  If any of your family members has a history of skin cancer, especially at a young age, talk with your health care provider about genetic screening.  Always use sunscreen. Apply sunscreen liberally and repeatedly throughout the day.  Whenever you are outside, protect  yourself by wearing long sleeves, pants, a wide-brimmed hat, and sunglasses.  What should I know about osteoporosis? Osteoporosis is a condition in which bone destruction happens more quickly than new bone creation. After menopause, you may be at an increased risk for osteoporosis. To help prevent osteoporosis or the bone fractures that can happen because of osteoporosis, the following is recommended:  If you are 76-40 years old, get at least 1,000 mg of calcium and at least 600 mg of vitamin D per day.  If you are older than age 36 but younger than age 45, get at least 1,200 mg of calcium and at least 600 mg of vitamin D per day.  If you are older than age 36, get at least 1,200 mg of calcium and at least 800 mg of vitamin D per day.  Smoking and excessive alcohol intake increase the risk of osteoporosis. Eat  foods that are rich in calcium and vitamin D, and do weight-bearing exercises several times each week as directed by your health care provider. What should I know about how menopause affects my mental health? Depression may occur at any age, but it is more common as you become older. Common symptoms of depression include:  Low or sad mood.  Changes in sleep patterns.  Changes in appetite or eating patterns.  Feeling an overall lack of motivation or enjoyment of activities that you previously enjoyed.  Frequent crying spells.  Talk with your health care provider if you think that you are experiencing depression. What should I know about immunizations? It is important that you get and maintain your immunizations. These include:  Tetanus, diphtheria, and pertussis (Tdap) booster vaccine.  Influenza every year before the flu season begins.  Pneumonia vaccine.  Shingles vaccine.  Your health care provider may also recommend other immunizations. This information is not intended to replace advice given to you by your health care provider. Make sure you discuss any questions you  have with your health care provider. Document Released: 11/27/2005 Document Revised: 04/24/2016 Document Reviewed: 07/09/2015 Elsevier Interactive Patient Education  2018 [employer].

## 2017-11-24 DIAGNOSIS — M25561 Pain in right knee: Secondary | ICD-10-CM | POA: Diagnosis not present

## 2017-11-24 DIAGNOSIS — S83241D Other tear of medial meniscus, current injury, right knee, subsequent encounter: Secondary | ICD-10-CM | POA: Diagnosis not present

## 2017-11-24 DIAGNOSIS — S83281D Other tear of lateral meniscus, current injury, right knee, subsequent encounter: Secondary | ICD-10-CM | POA: Diagnosis not present

## 2017-11-24 LAB — PAP, TP IMAGING W/ HPV RNA, RFLX HPV TYPE 16,18/45: HPV DNA High Risk: NOT DETECTED

## 2017-11-25 ENCOUNTER — Other Ambulatory Visit: Payer: Self-pay | Admitting: Obstetrics & Gynecology

## 2017-11-25 MED ORDER — FLUCONAZOLE 150 MG PO TABS: ORAL_TABLET | ORAL | 0 refills | Status: DC

## 2017-11-26 DIAGNOSIS — S83281D Other tear of lateral meniscus, current injury, right knee, subsequent encounter: Secondary | ICD-10-CM | POA: Diagnosis not present

## 2017-11-26 DIAGNOSIS — M25561 Pain in right knee: Secondary | ICD-10-CM | POA: Diagnosis not present

## 2017-11-26 DIAGNOSIS — R748 Abnormal levels of other serum enzymes: Secondary | ICD-10-CM | POA: Diagnosis not present

## 2017-11-26 DIAGNOSIS — S83241D Other tear of medial meniscus, current injury, right knee, subsequent encounter: Secondary | ICD-10-CM | POA: Diagnosis not present

## 2017-11-26 IMAGING — MR MR KNEE*R* W/O CM
4 of 5 series · 16 of 40 positions shown · non-contrast
Comparison: None.

CLINICAL DATA: The right knee pain since October 2016. No recent
injury. Remote surgery in 7447.

EXAM:
MRI OF THE RIGHT KNEE WITHOUT CONTRAST
TECHNIQUE: Multiplanar, multisequence MR imaging of the knee was performed. No
intravenous contrast was administered.

[Series 4: PD · axial · 4.0mm · 0.25mm/px · z∈[-56,+39]mm · 7 of 24 slices shown (1 of 2)]
[im 1/24]
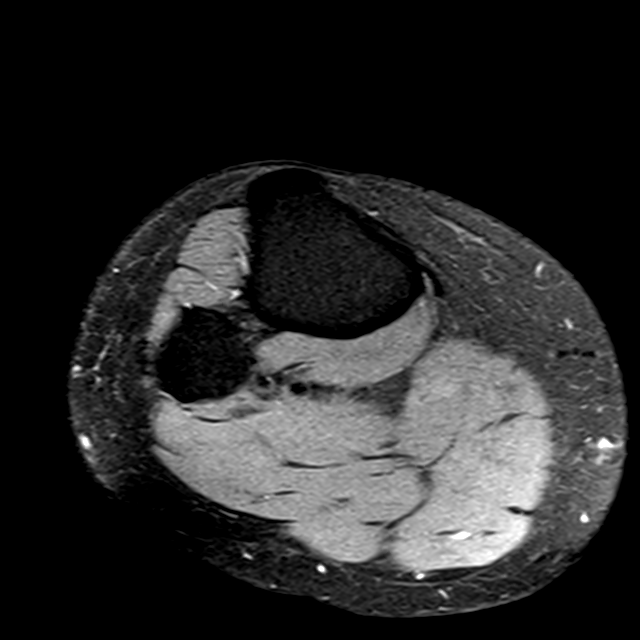
[im 3/24]
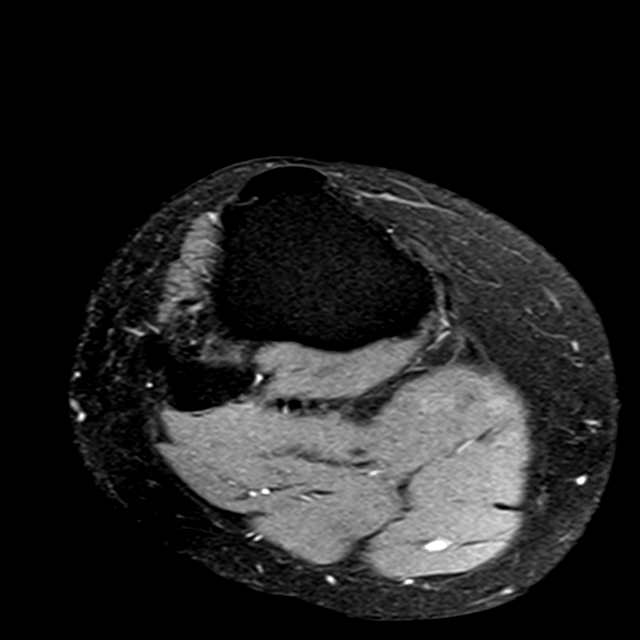
[im 8/24]
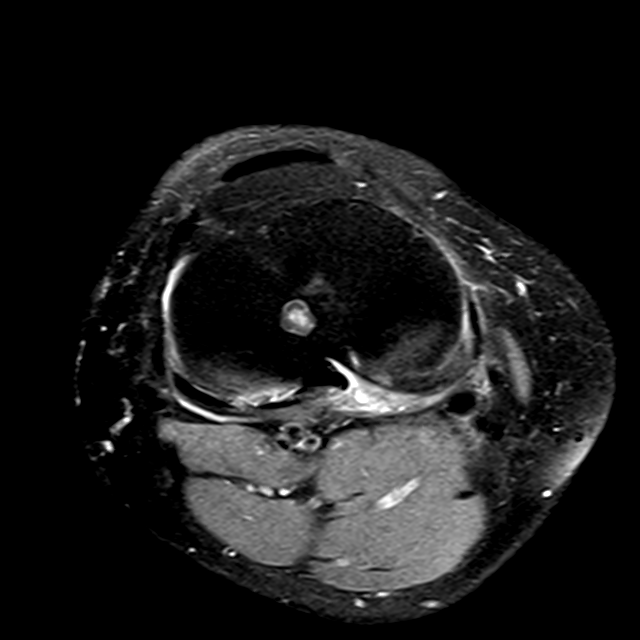
[im 11/24]
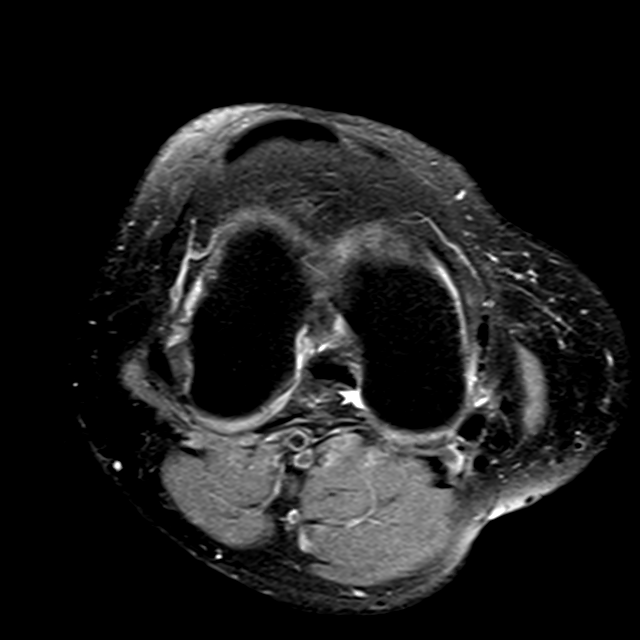
[im 13/24]
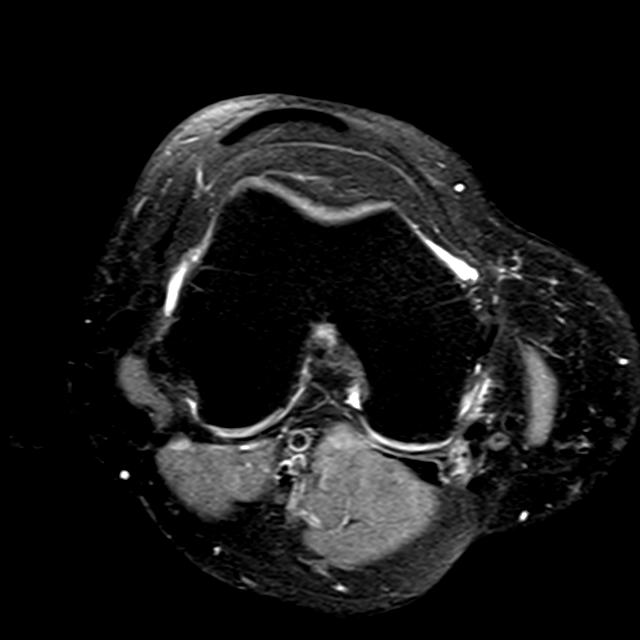
[im 16/24]
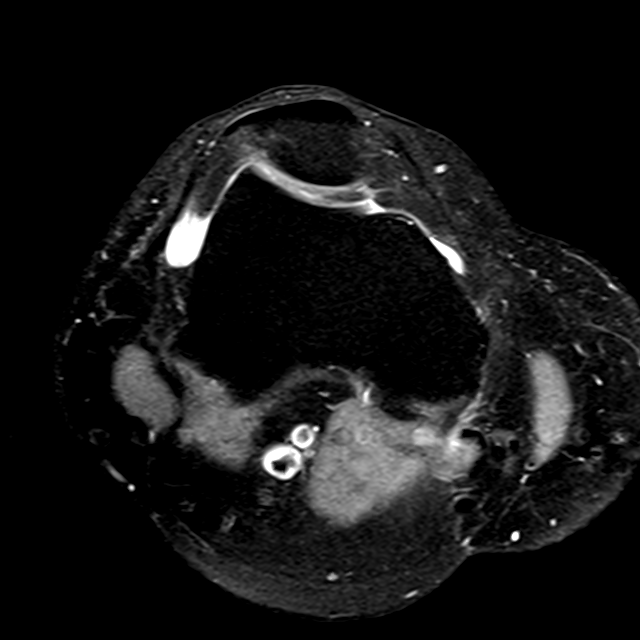
[im 21/24]
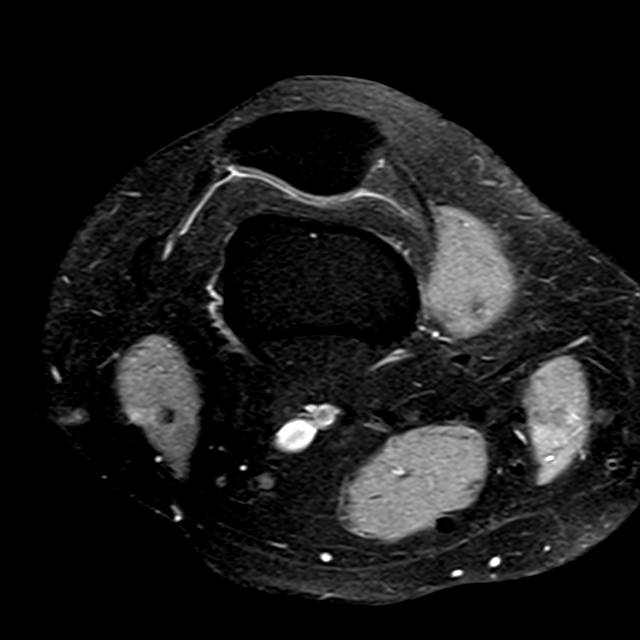

[Series 5: PD · coronal · 4.0mm · 0.53mm/px · 3 of 18 slices shown (2 of 2)]
[im 3/18]
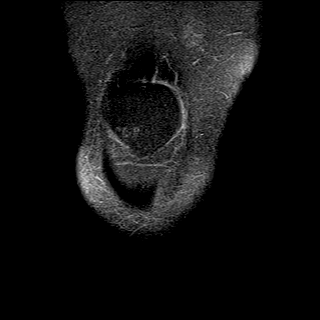
[im 9/18]
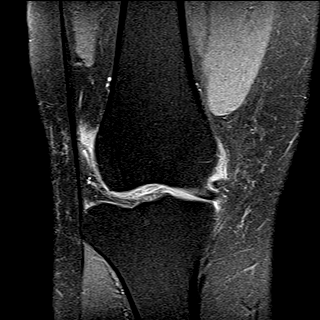
[im 15/18]
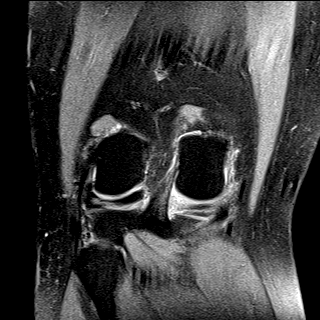

[Series 6: PD fat-sat · sagittal · 4.0mm · 0.31mm/px · 3 of 24 slices shown]
[im 3/24]
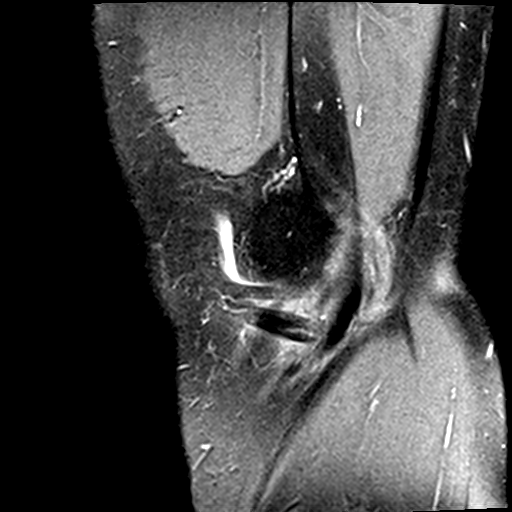
[im 12/24]
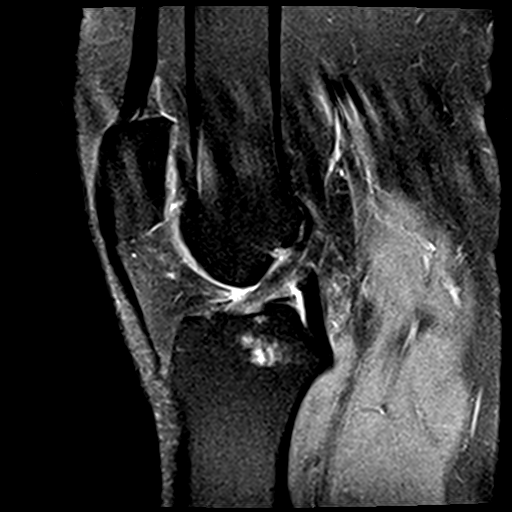
[im 21/24]
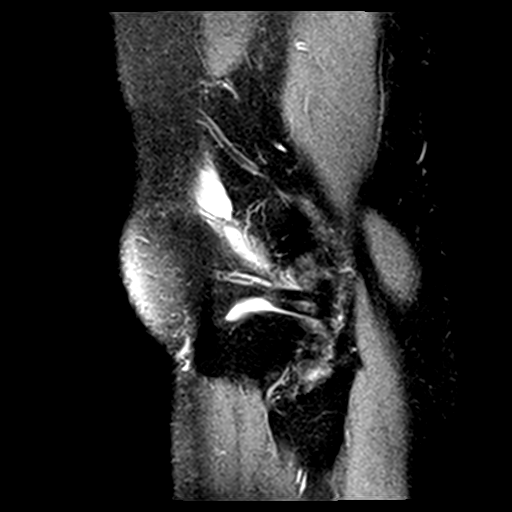

[Series 7: T1 · coronal · 4.0mm · 0.27mm/px · 3 of 18 slices shown]
[im 3/18]
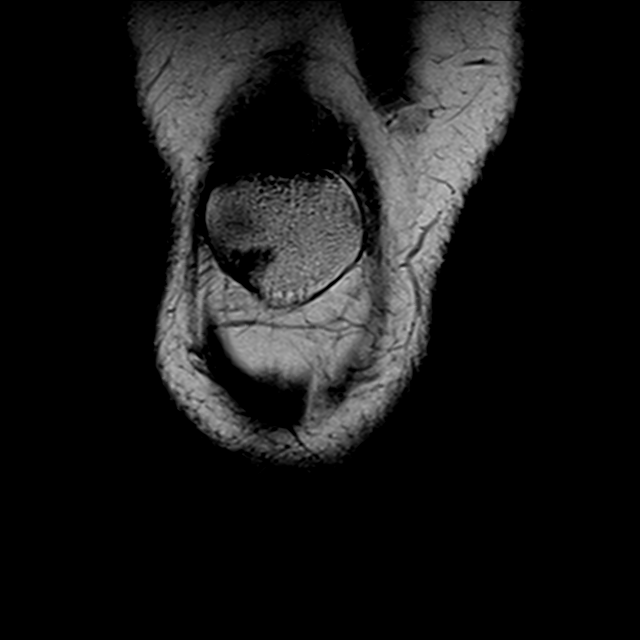
[im 9/18]
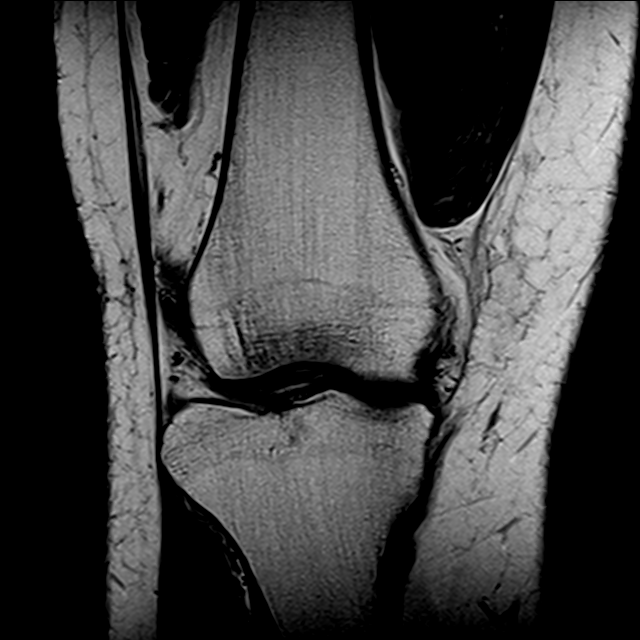
[im 15/18]
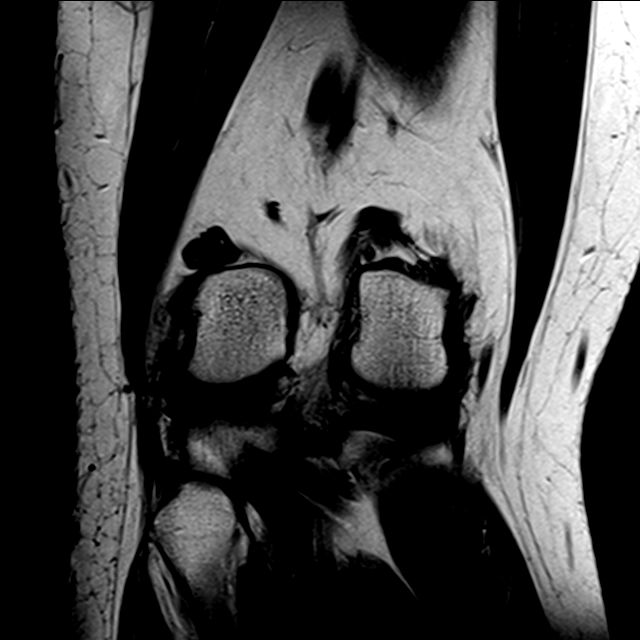

[16 of 40 positions shown; findings below may reference images not displayed]

FINDINGS: MENISCI

Medial meniscus: Complex tears. There is a radial tear involving the
posterior horn near the meniscal root with detachment and associated
medial protrusion of meniscus. There is also an oblique coursing
inferior articular surface tear in the posterior horn mid body
region.

Lateral meniscus: Oblique coursing inferior articular surface tear
involving the posterior horn mid body junction region.

LIGAMENTS

Cruciates:  Intact

Collaterals:  Intact

CARTILAGE

Patellofemoral: Moderate degenerative chondrosis with areas of
cartilage thinning and fissuring and fraying.

Medial: Moderate degenerative chondrosis with early joint space
narrowing and spurring.

Lateral: Moderate degenerative chondrosis with early joint space
narrowing and spurring.

Joint:  No joint effusion.

Popliteal Fossa:  No popliteal mass or Baker's cyst.

Extensor Mechanism: The patella retinacular structures are intact
and the quadriceps and patellar tendons intact.

Bones: No acute bony findings. Subchondral cysts are noted in the
region the tibial spines.

Other: Normal knee musculature.
IMPRESSION: 1. Medial and lateral meniscal tears as described above.
2. Intact ligamentous structures and no acute bony findings.
3. Moderate tricompartmental degenerative chondrosis.
4. No joint effusion or Baker's cyst.

## 2017-11-29 DIAGNOSIS — S83281D Other tear of lateral meniscus, current injury, right knee, subsequent encounter: Secondary | ICD-10-CM | POA: Diagnosis not present

## 2017-11-29 DIAGNOSIS — S83241D Other tear of medial meniscus, current injury, right knee, subsequent encounter: Secondary | ICD-10-CM | POA: Diagnosis not present

## 2017-11-29 DIAGNOSIS — M25561 Pain in right knee: Secondary | ICD-10-CM | POA: Diagnosis not present

## 2017-12-01 DIAGNOSIS — M25561 Pain in right knee: Secondary | ICD-10-CM | POA: Diagnosis not present

## 2017-12-01 DIAGNOSIS — S83281D Other tear of lateral meniscus, current injury, right knee, subsequent encounter: Secondary | ICD-10-CM | POA: Diagnosis not present

## 2017-12-01 DIAGNOSIS — S83241D Other tear of medial meniscus, current injury, right knee, subsequent encounter: Secondary | ICD-10-CM | POA: Diagnosis not present

## 2017-12-03 DIAGNOSIS — S83241D Other tear of medial meniscus, current injury, right knee, subsequent encounter: Secondary | ICD-10-CM | POA: Diagnosis not present

## 2017-12-03 DIAGNOSIS — M25561 Pain in right knee: Secondary | ICD-10-CM | POA: Diagnosis not present

## 2017-12-03 DIAGNOSIS — S83281D Other tear of lateral meniscus, current injury, right knee, subsequent encounter: Secondary | ICD-10-CM | POA: Diagnosis not present

## 2017-12-06 DIAGNOSIS — M25561 Pain in right knee: Secondary | ICD-10-CM | POA: Diagnosis not present

## 2017-12-06 DIAGNOSIS — S83281D Other tear of lateral meniscus, current injury, right knee, subsequent encounter: Secondary | ICD-10-CM | POA: Diagnosis not present

## 2017-12-06 DIAGNOSIS — S83241D Other tear of medial meniscus, current injury, right knee, subsequent encounter: Secondary | ICD-10-CM | POA: Diagnosis not present

## 2017-12-08 ENCOUNTER — Ambulatory Visit
Admission: RE | Admit: 2017-12-08 | Discharge: 2017-12-08 | Disposition: A | Discharge status: Home / Self Care | Payer: 59 | Source: Ambulatory Visit | Attending: Obstetrics & Gynecology | Admitting: Obstetrics & Gynecology

## 2017-12-08 DIAGNOSIS — Z1231 Encounter for screening mammogram for malignant neoplasm of breast: Secondary | ICD-10-CM | POA: Diagnosis not present

## 2017-12-08 DIAGNOSIS — M25561 Pain in right knee: Secondary | ICD-10-CM | POA: Diagnosis not present

## 2017-12-08 DIAGNOSIS — S83241D Other tear of medial meniscus, current injury, right knee, subsequent encounter: Secondary | ICD-10-CM | POA: Diagnosis not present

## 2017-12-08 DIAGNOSIS — S83281D Other tear of lateral meniscus, current injury, right knee, subsequent encounter: Secondary | ICD-10-CM | POA: Diagnosis not present

## 2017-12-10 DIAGNOSIS — R748 Abnormal levels of other serum enzymes: Secondary | ICD-10-CM | POA: Diagnosis not present

## 2017-12-10 DIAGNOSIS — S83241D Other tear of medial meniscus, current injury, right knee, subsequent encounter: Secondary | ICD-10-CM | POA: Diagnosis not present

## 2017-12-10 DIAGNOSIS — S83281D Other tear of lateral meniscus, current injury, right knee, subsequent encounter: Secondary | ICD-10-CM | POA: Diagnosis not present

## 2017-12-10 DIAGNOSIS — M25561 Pain in right knee: Secondary | ICD-10-CM | POA: Diagnosis not present

## 2017-12-13 DIAGNOSIS — S83241D Other tear of medial meniscus, current injury, right knee, subsequent encounter: Secondary | ICD-10-CM | POA: Diagnosis not present

## 2017-12-13 DIAGNOSIS — S83281D Other tear of lateral meniscus, current injury, right knee, subsequent encounter: Secondary | ICD-10-CM | POA: Diagnosis not present

## 2017-12-13 DIAGNOSIS — M25561 Pain in right knee: Secondary | ICD-10-CM | POA: Diagnosis not present

## 2017-12-15 DIAGNOSIS — S83281D Other tear of lateral meniscus, current injury, right knee, subsequent encounter: Secondary | ICD-10-CM | POA: Diagnosis not present

## 2017-12-15 DIAGNOSIS — S83241D Other tear of medial meniscus, current injury, right knee, subsequent encounter: Secondary | ICD-10-CM | POA: Diagnosis not present

## 2017-12-15 DIAGNOSIS — M25561 Pain in right knee: Secondary | ICD-10-CM | POA: Diagnosis not present

## 2017-12-16 DIAGNOSIS — M25561 Pain in right knee: Secondary | ICD-10-CM | POA: Diagnosis not present

## 2017-12-31 DIAGNOSIS — S83241D Other tear of medial meniscus, current injury, right knee, subsequent encounter: Secondary | ICD-10-CM | POA: Diagnosis not present

## 2017-12-31 DIAGNOSIS — M25561 Pain in right knee: Secondary | ICD-10-CM | POA: Diagnosis not present

## 2017-12-31 DIAGNOSIS — S83281D Other tear of lateral meniscus, current injury, right knee, subsequent encounter: Secondary | ICD-10-CM | POA: Diagnosis not present

## 2018-01-05 DIAGNOSIS — S83241D Other tear of medial meniscus, current injury, right knee, subsequent encounter: Secondary | ICD-10-CM | POA: Diagnosis not present

## 2018-01-05 DIAGNOSIS — S83281D Other tear of lateral meniscus, current injury, right knee, subsequent encounter: Secondary | ICD-10-CM | POA: Diagnosis not present

## 2018-01-05 DIAGNOSIS — M25561 Pain in right knee: Secondary | ICD-10-CM | POA: Diagnosis not present

## 2018-01-10 DIAGNOSIS — S83281D Other tear of lateral meniscus, current injury, right knee, subsequent encounter: Secondary | ICD-10-CM | POA: Diagnosis not present

## 2018-01-10 DIAGNOSIS — S83241D Other tear of medial meniscus, current injury, right knee, subsequent encounter: Secondary | ICD-10-CM | POA: Diagnosis not present

## 2018-01-10 DIAGNOSIS — M25561 Pain in right knee: Secondary | ICD-10-CM | POA: Diagnosis not present

## 2018-01-18 DIAGNOSIS — M25561 Pain in right knee: Secondary | ICD-10-CM | POA: Diagnosis not present

## 2018-01-18 DIAGNOSIS — S83281D Other tear of lateral meniscus, current injury, right knee, subsequent encounter: Secondary | ICD-10-CM | POA: Diagnosis not present

## 2018-01-18 DIAGNOSIS — S83241D Other tear of medial meniscus, current injury, right knee, subsequent encounter: Secondary | ICD-10-CM | POA: Diagnosis not present

## 2018-01-31 DIAGNOSIS — M25561 Pain in right knee: Secondary | ICD-10-CM | POA: Diagnosis not present

## 2018-01-31 DIAGNOSIS — S83241D Other tear of medial meniscus, current injury, right knee, subsequent encounter: Secondary | ICD-10-CM | POA: Diagnosis not present

## 2018-01-31 DIAGNOSIS — S83281D Other tear of lateral meniscus, current injury, right knee, subsequent encounter: Secondary | ICD-10-CM | POA: Diagnosis not present

## 2018-02-08 DIAGNOSIS — M25561 Pain in right knee: Secondary | ICD-10-CM | POA: Diagnosis not present

## 2018-02-08 DIAGNOSIS — S83241D Other tear of medial meniscus, current injury, right knee, subsequent encounter: Secondary | ICD-10-CM | POA: Diagnosis not present

## 2018-02-08 DIAGNOSIS — S83281D Other tear of lateral meniscus, current injury, right knee, subsequent encounter: Secondary | ICD-10-CM | POA: Diagnosis not present

## 2018-02-14 DIAGNOSIS — M25561 Pain in right knee: Secondary | ICD-10-CM | POA: Diagnosis not present

## 2018-02-14 DIAGNOSIS — S83281D Other tear of lateral meniscus, current injury, right knee, subsequent encounter: Secondary | ICD-10-CM | POA: Diagnosis not present

## 2018-02-14 DIAGNOSIS — S83241D Other tear of medial meniscus, current injury, right knee, subsequent encounter: Secondary | ICD-10-CM | POA: Diagnosis not present

## 2018-02-15 DIAGNOSIS — M25571 Pain in right ankle and joints of right foot: Secondary | ICD-10-CM | POA: Diagnosis not present

## 2018-02-23 DIAGNOSIS — M79672 Pain in left foot: Secondary | ICD-10-CM | POA: Diagnosis not present

## 2018-02-23 DIAGNOSIS — M722 Plantar fascial fibromatosis: Secondary | ICD-10-CM | POA: Diagnosis not present

## 2018-02-23 DIAGNOSIS — M79671 Pain in right foot: Secondary | ICD-10-CM | POA: Diagnosis not present

## 2018-02-28 DIAGNOSIS — M722 Plantar fascial fibromatosis: Secondary | ICD-10-CM | POA: Diagnosis not present

## 2018-02-28 DIAGNOSIS — M79671 Pain in right foot: Secondary | ICD-10-CM | POA: Diagnosis not present

## 2018-02-28 DIAGNOSIS — M79672 Pain in left foot: Secondary | ICD-10-CM | POA: Diagnosis not present

## 2018-03-02 DIAGNOSIS — R262 Difficulty in walking, not elsewhere classified: Secondary | ICD-10-CM | POA: Diagnosis not present

## 2018-03-02 DIAGNOSIS — M79672 Pain in left foot: Secondary | ICD-10-CM | POA: Diagnosis not present

## 2018-03-02 DIAGNOSIS — M722 Plantar fascial fibromatosis: Secondary | ICD-10-CM | POA: Diagnosis not present

## 2018-03-04 DIAGNOSIS — M722 Plantar fascial fibromatosis: Secondary | ICD-10-CM | POA: Diagnosis not present

## 2018-03-04 DIAGNOSIS — M79671 Pain in right foot: Secondary | ICD-10-CM | POA: Diagnosis not present

## 2018-03-04 DIAGNOSIS — M79672 Pain in left foot: Secondary | ICD-10-CM | POA: Diagnosis not present

## 2018-03-15 DIAGNOSIS — M79671 Pain in right foot: Secondary | ICD-10-CM | POA: Diagnosis not present

## 2018-03-15 DIAGNOSIS — M79672 Pain in left foot: Secondary | ICD-10-CM | POA: Diagnosis not present

## 2018-03-15 DIAGNOSIS — M722 Plantar fascial fibromatosis: Secondary | ICD-10-CM | POA: Diagnosis not present

## 2018-03-17 DIAGNOSIS — M722 Plantar fascial fibromatosis: Secondary | ICD-10-CM | POA: Diagnosis not present

## 2018-03-17 DIAGNOSIS — M79672 Pain in left foot: Secondary | ICD-10-CM | POA: Diagnosis not present

## 2018-03-17 DIAGNOSIS — M79671 Pain in right foot: Secondary | ICD-10-CM | POA: Diagnosis not present

## 2018-03-23 DIAGNOSIS — M722 Plantar fascial fibromatosis: Secondary | ICD-10-CM | POA: Diagnosis not present

## 2018-03-23 DIAGNOSIS — M79672 Pain in left foot: Secondary | ICD-10-CM | POA: Diagnosis not present

## 2018-03-23 DIAGNOSIS — M79671 Pain in right foot: Secondary | ICD-10-CM | POA: Diagnosis not present

## 2018-03-25 DIAGNOSIS — M79671 Pain in right foot: Secondary | ICD-10-CM | POA: Diagnosis not present

## 2018-03-25 DIAGNOSIS — M79672 Pain in left foot: Secondary | ICD-10-CM | POA: Diagnosis not present

## 2018-03-25 DIAGNOSIS — M722 Plantar fascial fibromatosis: Secondary | ICD-10-CM | POA: Diagnosis not present

## 2018-03-28 DIAGNOSIS — M722 Plantar fascial fibromatosis: Secondary | ICD-10-CM | POA: Diagnosis not present

## 2018-03-28 DIAGNOSIS — M79671 Pain in right foot: Secondary | ICD-10-CM | POA: Diagnosis not present

## 2018-03-28 DIAGNOSIS — M79672 Pain in left foot: Secondary | ICD-10-CM | POA: Diagnosis not present

## 2018-05-20 DIAGNOSIS — M4126 Other idiopathic scoliosis, lumbar region: Secondary | ICD-10-CM | POA: Diagnosis not present

## 2018-05-20 DIAGNOSIS — M545 Low back pain: Secondary | ICD-10-CM | POA: Diagnosis not present

## 2018-05-20 DIAGNOSIS — M9902 Segmental and somatic dysfunction of thoracic region: Secondary | ICD-10-CM | POA: Diagnosis not present

## 2018-05-23 ENCOUNTER — Ambulatory Visit: Payer: 59 | Admitting: Obstetrics & Gynecology

## 2018-05-27 DIAGNOSIS — M9902 Segmental and somatic dysfunction of thoracic region: Secondary | ICD-10-CM | POA: Diagnosis not present

## 2018-05-27 DIAGNOSIS — M545 Low back pain: Secondary | ICD-10-CM | POA: Diagnosis not present

## 2018-05-27 DIAGNOSIS — M4126 Other idiopathic scoliosis, lumbar region: Secondary | ICD-10-CM | POA: Diagnosis not present

## 2018-05-30 ENCOUNTER — Encounter: Payer: Self-pay | Admitting: Obstetrics & Gynecology

## 2018-05-30 ENCOUNTER — Ambulatory Visit (INDEPENDENT_AMBULATORY_CARE_PROVIDER_SITE_OTHER): Payer: 59 | Admitting: Obstetrics & Gynecology

## 2018-05-30 VITALS — BP 122/80

## 2018-05-30 DIAGNOSIS — Z30433 Encounter for removal and reinsertion of intrauterine contraceptive device: Secondary | ICD-10-CM

## 2018-05-30 NOTE — Patient Instructions (Signed)
  1. Encounter for removal and reinsertion of intrauterine contraceptive device (IUD) Easy removal and reinsertion of Mirena IUD.  No complications.  Patient will follow-up in 4 weeks for IUD check.  Doylene Canard, good seeing you today!   IUD PLACEMENT POST-PROCEDURE INSTRUCTIONS  1. You may take Ibuprofen, Aleve or Tylenol for pain if needed.  Cramping should resolve within in 24 hours.  2. You may have a small amount of spotting.  You should wear a mini pad for the next few days.  3. You may have intercourse after 24 hours.  If you using this for birth control, it is effective immediately.  4. You need to call if you have any pelvic pain, fever, heavy bleeding or foul smelling vaginal discharge.  Irregular bleeding is common the first several months after having an IUD placed. You do not need to call for this reason unless you are concerned.  5. Shower or bathe as normal  6. You should have a follow-up appointment in 4-8 weeks for a re-check to make sure you are not having any problems.

## 2018-05-30 NOTE — Progress Notes (Signed)
    Patty Hale 12-07-1970 [id number]        47 y.o.  G2P2002   RP: Removal/Insertion of Mirena IUD  HPI: Well on Mirena IUD x 5 years.  No pelvic pain, no abnormal bleeding.  Probably perimenopausal.   OB History  Gravida Para Term Preterm AB Living  2 2 2     2   SAB TAB Ectopic Multiple Live Births          2    # Outcome Date GA Lbr Len/2nd Weight Sex Delivery Anes PTL Lv  2 Term     M Vag-Spont  N LIV  1 Term     F Vag-Spont  N LIV    Past medical history,surgical history, problem list, medications, allergies, family history and social history were all reviewed and documented in the EPIC chart.   Directed ROS with pertinent positives and negatives documented in the history of present illness/assessment and plan.  Exam:  Vitals:   05/30/18 1618  BP: 122/80   General appearance:  Normal                                                                    IUD procedure note       Patient presented to the office today for removal and insertion of a Mirena IUD. The patient had previously been provided with literature information on this method of contraception. The risks benefits and pros and cons were discussed and all her questions were answered. She is fully aware that this form of contraception is 99% effective and is good for 5 years.  Pelvic exam: Vulva normal Vagina: No lesions or discharge Cervix: No lesions or discharge Uterus: AV position, normal size Adnexa: No masses or tenderness Rectal exam: Not done  The IUD strings are visible.  Easy removal of the IUD by pulling on the strings.  IUD intact complete, shown to patient and discarded.  The cervix was cleansed with Betadine solution. Hurricane spray on the cervix.  A single-tooth tenaculum was placed on the anterior cervical lip. The IUD was shown to the patient and inserted in a sterile fashion.  Hysterometry with the IUD as being inserted was 8 cm.  The IUD string was trimmed short at patient's request. The  single-tooth tenaculum was removed. Patient was instructed to return back to the office in one month for follow up.        Assessment/Plan:  47 y.o. G2P2002   1. Encounter for removal and reinsertion of intrauterine contraceptive device (IUD) Easy removal and reinsertion of Mirena IUD.  No complications.  Patient will follow-up in 4 weeks for IUD check.  52 MD, 5:01 PM 05/30/2018

## 2018-05-31 DIAGNOSIS — M9902 Segmental and somatic dysfunction of thoracic region: Secondary | ICD-10-CM | POA: Diagnosis not present

## 2018-05-31 DIAGNOSIS — M545 Low back pain: Secondary | ICD-10-CM | POA: Diagnosis not present

## 2018-05-31 DIAGNOSIS — M4126 Other idiopathic scoliosis, lumbar region: Secondary | ICD-10-CM | POA: Diagnosis not present

## 2018-06-02 DIAGNOSIS — M545 Low back pain: Secondary | ICD-10-CM | POA: Diagnosis not present

## 2018-06-02 DIAGNOSIS — M9902 Segmental and somatic dysfunction of thoracic region: Secondary | ICD-10-CM | POA: Diagnosis not present

## 2018-06-02 DIAGNOSIS — M4126 Other idiopathic scoliosis, lumbar region: Secondary | ICD-10-CM | POA: Diagnosis not present

## 2018-06-07 DIAGNOSIS — M4126 Other idiopathic scoliosis, lumbar region: Secondary | ICD-10-CM | POA: Diagnosis not present

## 2018-06-07 DIAGNOSIS — R293 Abnormal posture: Secondary | ICD-10-CM | POA: Diagnosis not present

## 2018-06-07 DIAGNOSIS — M545 Low back pain: Secondary | ICD-10-CM | POA: Diagnosis not present

## 2018-06-10 DIAGNOSIS — M4126 Other idiopathic scoliosis, lumbar region: Secondary | ICD-10-CM | POA: Diagnosis not present

## 2018-06-10 DIAGNOSIS — R293 Abnormal posture: Secondary | ICD-10-CM | POA: Diagnosis not present

## 2018-06-10 DIAGNOSIS — M545 Low back pain: Secondary | ICD-10-CM | POA: Diagnosis not present

## 2018-06-14 ENCOUNTER — Encounter: Payer: Self-pay | Admitting: Anesthesiology

## 2018-06-14 DIAGNOSIS — R293 Abnormal posture: Secondary | ICD-10-CM | POA: Diagnosis not present

## 2018-06-14 DIAGNOSIS — M545 Low back pain: Secondary | ICD-10-CM | POA: Diagnosis not present

## 2018-06-14 DIAGNOSIS — M4126 Other idiopathic scoliosis, lumbar region: Secondary | ICD-10-CM | POA: Diagnosis not present

## 2018-06-16 DIAGNOSIS — M545 Low back pain: Secondary | ICD-10-CM | POA: Diagnosis not present

## 2018-06-16 DIAGNOSIS — R293 Abnormal posture: Secondary | ICD-10-CM | POA: Diagnosis not present

## 2018-06-16 DIAGNOSIS — M4126 Other idiopathic scoliosis, lumbar region: Secondary | ICD-10-CM | POA: Diagnosis not present

## 2018-06-21 DIAGNOSIS — M545 Low back pain: Secondary | ICD-10-CM | POA: Diagnosis not present

## 2018-06-21 DIAGNOSIS — R293 Abnormal posture: Secondary | ICD-10-CM | POA: Diagnosis not present

## 2018-06-21 DIAGNOSIS — M4126 Other idiopathic scoliosis, lumbar region: Secondary | ICD-10-CM | POA: Diagnosis not present

## 2018-06-27 DIAGNOSIS — M545 Low back pain: Secondary | ICD-10-CM | POA: Diagnosis not present

## 2018-06-27 DIAGNOSIS — M4126 Other idiopathic scoliosis, lumbar region: Secondary | ICD-10-CM | POA: Diagnosis not present

## 2018-06-27 DIAGNOSIS — R293 Abnormal posture: Secondary | ICD-10-CM | POA: Diagnosis not present

## 2018-06-28 DIAGNOSIS — M545 Low back pain: Secondary | ICD-10-CM | POA: Diagnosis not present

## 2018-06-28 DIAGNOSIS — R293 Abnormal posture: Secondary | ICD-10-CM | POA: Diagnosis not present

## 2018-06-28 DIAGNOSIS — M4126 Other idiopathic scoliosis, lumbar region: Secondary | ICD-10-CM | POA: Diagnosis not present

## 2018-07-05 DIAGNOSIS — M4126 Other idiopathic scoliosis, lumbar region: Secondary | ICD-10-CM | POA: Diagnosis not present

## 2018-07-05 DIAGNOSIS — R293 Abnormal posture: Secondary | ICD-10-CM | POA: Diagnosis not present

## 2018-07-05 DIAGNOSIS — M545 Low back pain: Secondary | ICD-10-CM | POA: Diagnosis not present

## 2018-07-06 DIAGNOSIS — R293 Abnormal posture: Secondary | ICD-10-CM | POA: Diagnosis not present

## 2018-07-06 DIAGNOSIS — M4126 Other idiopathic scoliosis, lumbar region: Secondary | ICD-10-CM | POA: Diagnosis not present

## 2018-07-06 DIAGNOSIS — M545 Low back pain: Secondary | ICD-10-CM | POA: Diagnosis not present

## 2018-07-11 ENCOUNTER — Encounter: Payer: Self-pay | Admitting: Obstetrics & Gynecology

## 2018-07-11 ENCOUNTER — Ambulatory Visit (INDEPENDENT_AMBULATORY_CARE_PROVIDER_SITE_OTHER): Payer: 59

## 2018-07-11 ENCOUNTER — Ambulatory Visit: Payer: 59 | Admitting: Obstetrics & Gynecology

## 2018-07-11 VITALS — BP 120/70

## 2018-07-11 DIAGNOSIS — Z30431 Encounter for routine checking of intrauterine contraceptive device: Secondary | ICD-10-CM

## 2018-07-11 DIAGNOSIS — T8332XA Displacement of intrauterine contraceptive device, initial encounter: Secondary | ICD-10-CM

## 2018-07-11 NOTE — Patient Instructions (Signed)
1. Intrauterine contraceptive device threads lost, initial encounter Mirena IUD in good intrauterine position confirmed by pelvic ultrasound today.  Mirena IUD well-tolerated and no sign of infection.  Patient reassured.  Follow-up at next annual gynecologic exam. - US Transvaginal Non-OB; Future  Karry, good seeing you today!

## 2018-07-11 NOTE — Progress Notes (Signed)
    Patty Hale 24-Jul-1971 [id number]        47 y.o.  G2P2002   RP: IUD check post insertion of Mirena IUD on 05/30/2018  HPI: Well since Mirena IUD insertion on May 30, 2018.  No pelvic pain.  No abnormal vaginal bleeding.  No abnormal vaginal discharge.  No fever.  Not sexually active since insertion.   OB History  Gravida Para Term Preterm AB Living  2 2 2     2   SAB TAB Ectopic Multiple Live Births          2    # Outcome Date GA Lbr Len/2nd Weight Sex Delivery Anes PTL Lv  2 Term     M Vag-Spont  N LIV  1 Term     F Vag-Spont  N LIV    Past medical history,surgical history, problem list, medications, allergies, family history and social history were all reviewed and documented in the EPIC chart.   Directed ROS with pertinent positives and negatives documented in the history of present illness/assessment and plan.  Exam:  Vitals:   07/11/18 1028  BP: 120/70   General appearance:  Normal  Gynecologic exam: Vulva normal.  Speculum: Cervix normal, no sign of infection.  IUD strings not visible (patient had requested for the strings to be cut short).  Vagina normal.  Pelvic 07/13/18 today: T/V images.  Uterus anteverted homogeneous measuring 7.56 x 5.22 x 3.99 cm.  The endometrial lining is thin and normal at 2.8 mm.  IUD seen in normal intrauterine position.  Right and left ovaries normal.  No apparent mass in the right and left adnexa.  No free fluid in the posterior cul-de-sac.   Assessment/Plan:  47 y.o. G2P2002   1. Intrauterine contraceptive device threads lost, initial encounter Mirena IUD in good intrauterine position confirmed by pelvic ultrasound today.  Mirena IUD well-tolerated and no sign of infection.  Patient reassured.  Follow-up at next annual gynecologic exam. - 52 Transvaginal Non-OB; Future  Counseling on above issues and coordination of care more than 50% for 15 minutes.  [place] MD, 10:34 AM 07/11/2018

## 2018-09-12 DIAGNOSIS — M79672 Pain in left foot: Secondary | ICD-10-CM | POA: Diagnosis not present

## 2018-09-29 DIAGNOSIS — R0981 Nasal congestion: Secondary | ICD-10-CM | POA: Diagnosis not present

## 2018-11-25 ENCOUNTER — Encounter: Payer: Self-pay | Admitting: Obstetrics & Gynecology

## 2018-11-25 ENCOUNTER — Ambulatory Visit: Payer: 59 | Admitting: Obstetrics & Gynecology

## 2018-11-25 VITALS — BP 118/70 | Ht 66.0 in | Wt 189.0 lb

## 2018-11-25 DIAGNOSIS — R87610 Atypical squamous cells of undetermined significance on cytologic smear of cervix (ASC-US): Secondary | ICD-10-CM | POA: Diagnosis not present

## 2018-11-25 DIAGNOSIS — E6609 Other obesity due to excess calories: Secondary | ICD-10-CM

## 2018-11-25 DIAGNOSIS — Z01419 Encounter for gynecological examination (general) (routine) without abnormal findings: Secondary | ICD-10-CM | POA: Diagnosis not present

## 2018-11-25 DIAGNOSIS — Z6830 Body mass index (BMI) 30.0-30.9, adult: Secondary | ICD-10-CM

## 2018-11-25 DIAGNOSIS — Z30431 Encounter for routine checking of intrauterine contraceptive device: Secondary | ICD-10-CM

## 2018-11-25 NOTE — Addendum Note (Signed)
Addended by: Becky Sax on: 11/25/2018 09:56 AM   Modules accepted: Orders

## 2018-11-25 NOTE — Patient Instructions (Signed)
1. Encounter for routine gynecological examination with Papanicolaou smear of cervix Normal gynecologic exam.  Pap test showed ASCUS with negative high-risk HPV last year.  Pap test with high risk HPV repeated today.  Breast exam normal.  Screening mammogram February 2019 was negative, patient will schedule again now.  Fasting health labs done here today. - CBC - Comp Met (CMET) - TSH - Lipid panel - VITAMIN D 25 Hydroxy (Vit-D Deficiency, Fractures)  2. ASCUS of cervix with negative high risk HPV Pap test with high risk HPV done today.    3. Encounter for routine checking of intrauterine contraceptive device (IUD) Well on Mirena IUD since August 2019.  Confirmation of intrauterine IUD by ultrasound September 2019 because the strings are not visible.  4. Class 1 obesity due to excess calories without serious comorbidity with body mass index (BMI) of 30.0 to 30.9 in adult Recommend a lower calorie/carb diet such as [employer].  Intermittent fasting reviewed with patient.  Recommend aerobic physical activities 5 times a week and weightlifting every 2 days.  Other orders - cholecalciferol (VITAMIN D3) 25 MCG (1000 UT) tablet; Take 1,000 Units by mouth daily. - vitamin E 400 UNIT capsule; Take 400 Units by mouth daily.  Patty Hale, it was a pleasure seeing you today!  I will inform you of your results as soon as they are available.

## 2018-11-25 NOTE — Progress Notes (Signed)
Patty Hale 1971-06-09 [id number]   History:    48 y.o. G2P2L2 Married  RP:  Established patient presenting for annual gyn exam   HPI: Well on Mirena IUD since August 2019.  Strings not visible at the exocervix so a pelvic ultrasound was done September 2019 confirming good intra-uterine location of the IUD.  No menstrual periods and no breakthrough bleeding.  No pelvic pain.  Rarely sexually active.  No pain with intercourse.  Breasts normal.  Body mass index 30.51.  Lost weight since last year on a low calorie diet.  Needs to increase physical activity.  Fasting health labs here today.  Past medical history,surgical history, family history and social history were all reviewed and documented in the EPIC chart.  Gynecologic History No LMP recorded. (Menstrual status: IUD). Contraception: Mirena IUD x 05/2018 Last Pap: 11/2017. Results were: ASCUS/HPV HR negative Last mammogram: 11/2017. Results were: Negative Bone Density: Never Colonoscopy: Never  Obstetric History OB History  Gravida Para Term Preterm AB Living  '2 2 2     2  ' SAB TAB Ectopic Multiple Live Births          2    # Outcome Date GA Lbr Len/2nd Weight Sex Delivery Anes PTL Lv  2 Term     M Vag-Spont  N LIV  1 Term     F Vag-Spont  N LIV     ROS: A ROS was performed and pertinent positives and negatives are included in the history.  GENERAL: No fevers or chills. HEENT: No change in vision, no earache, sore throat or sinus congestion. NECK: No pain or stiffness. CARDIOVASCULAR: No chest pain or pressure. No palpitations. PULMONARY: No shortness of breath, cough or wheeze. GASTROINTESTINAL: No abdominal pain, nausea, vomiting or diarrhea, melena or bright red blood per rectum. GENITOURINARY: No urinary frequency, urgency, hesitancy or dysuria. MUSCULOSKELETAL: No joint or muscle pain, no back pain, no recent trauma. DERMATOLOGIC: No rash, no itching, no lesions. ENDOCRINE: No polyuria, polydipsia, no heat or cold  intolerance. No recent change in weight. HEMATOLOGICAL: No anemia or easy bruising or bleeding. NEUROLOGIC: No headache, seizures, numbness, tingling or weakness. PSYCHIATRIC: No depression, no loss of interest in normal activity or change in sleep pattern.     Exam:   BP 118/70   Ht '5\' 6"'  (1.676 m)   Wt 189 lb (85.7 kg)   BMI 30.51 kg/m   Body mass index is 30.51 kg/m.  General appearance : Well developed well nourished female. No acute distress HEENT: Eyes: no retinal hemorrhage or exudates,  Neck supple, trachea midline, no carotid bruits, no thyroidmegaly Lungs: Clear to auscultation, no rhonchi or wheezes, or rib retractions  Heart: Regular rate and rhythm, no murmurs or gallops Breast:Examined in sitting and supine position were symmetrical in appearance, no palpable masses or tenderness,  no skin retraction, no nipple inversion, no nipple discharge, no skin discoloration, no axillary or supraclavicular lymphadenopathy Abdomen: no palpable masses or tenderness, no rebound or guarding Extremities: no edema or skin discoloration or tenderness  Pelvic: Vulva: Normal             Vagina: No gross lesions or discharge  Cervix: No gross lesions or discharge. IUD strings still not visible. Pap/HPV HR done.  Uterus  AV, normal size, shape and consistency, non-tender and mobile  Adnexa  Without masses or tenderness  Anus: Normal   Assessment/Plan:  48 y.o. female for annual exam   1. Encounter for routine gynecological examination  with Papanicolaou smear of cervix Normal gynecologic exam.  Pap test showed ASCUS with negative high-risk HPV last year.  Pap test with high risk HPV repeated today.  Breast exam normal.  Screening mammogram February 2019 was negative, patient will schedule again now.  Fasting health labs done here today. - CBC - Comp Met (CMET) - TSH - Lipid panel - VITAMIN D 25 Hydroxy (Vit-D Deficiency, Fractures)  2. ASCUS of cervix with negative high risk HPV Pap  test with high risk HPV done today.    3. Encounter for routine checking of intrauterine contraceptive device (IUD) Well on Mirena IUD since August 2019.  Confirmation of intrauterine IUD by ultrasound September 2019 because the strings are not visible.  4. Class 1 obesity due to excess calories without serious comorbidity with body mass index (BMI) of 30.0 to 30.9 in adult Recommend a lower calorie/carb diet such as [employer].  Intermittent fasting reviewed with patient.  Recommend aerobic physical activities 5 times a week and weightlifting every 2 days.  Other orders - cholecalciferol (VITAMIN D3) 25 MCG (1000 UT) tablet; Take 1,000 Units by mouth daily. - vitamin E 400 UNIT capsule; Take 400 Units by mouth daily.  Princess Bruins MD, 8:52 AM 11/25/2018

## 2018-11-26 LAB — CBC
HCT: 38.3 % (ref 35.0–45.0)
Hemoglobin: 12.2 g/dL (ref 11.7–15.5)
MCH: 28.1 pg (ref 27.0–33.0)
MCHC: 31.9 g/dL — ABNORMAL LOW (ref 32.0–36.0)
MCV: 88.2 fL (ref 80.0–100.0)
MPV: 12 fL (ref 7.5–12.5)
Platelets: 196 10*3/uL (ref 140–400)
RBC: 4.34 10*6/uL (ref 3.80–5.10)
RDW: 13.4 % (ref 11.0–15.0)
WBC: 5.4 10*3/uL (ref 3.8–10.8)

## 2018-11-26 LAB — COMPREHENSIVE METABOLIC PANEL
AG Ratio: 1.6 (calc) (ref 1.0–2.5)
ALT: 16 U/L (ref 6–29)
AST: 15 U/L (ref 10–35)
Albumin: 3.9 g/dL (ref 3.6–5.1)
Alkaline phosphatase (APISO): 94 U/L (ref 31–125)
BUN/Creatinine Ratio: 9 (calc) (ref 6–22)
BUN: 11 mg/dL (ref 7–25)
CO2: 27 mmol/L (ref 20–32)
Calcium: 9.4 mg/dL (ref 8.6–10.2)
Chloride: 108 mmol/L (ref 98–110)
Creat: 1.22 mg/dL — ABNORMAL HIGH (ref 0.50–1.10)
Globulin: 2.4 g/dL (calc) (ref 1.9–3.7)
Glucose, Bld: 88 mg/dL (ref 65–99)
Potassium: 4.4 mmol/L (ref 3.5–5.3)
Sodium: 140 mmol/L (ref 135–146)
Total Bilirubin: 0.6 mg/dL (ref 0.2–1.2)
Total Protein: 6.3 g/dL (ref 6.1–8.1)

## 2018-11-26 LAB — TSH: TSH: 2.87 mIU/L

## 2018-11-26 LAB — LIPID PANEL
Cholesterol: 162 mg/dL (ref <200)
HDL: 49 mg/dL — ABNORMAL LOW (ref >=50)
LDL Cholesterol (Calc): 100 mg/dL (calc) — ABNORMAL HIGH
Non-HDL Cholesterol (Calc): 113 mg/dL (calc) (ref <130)
Total CHOL/HDL Ratio: 3.3 (calc) (ref <5.0)
Triglycerides: 50 mg/dL (ref <150)

## 2018-11-26 LAB — VITAMIN D 25 HYDROXY (VIT D DEFICIENCY, FRACTURES): Vit D, 25-Hydroxy: 28 ng/mL — ABNORMAL LOW (ref 30–100)

## 2018-11-29 LAB — PAP, TP IMAGING W/ HPV RNA, RFLX HPV TYPE 16,18/45: HPV DNA High Risk: NOT DETECTED

## 2019-01-09 ENCOUNTER — Other Ambulatory Visit: Payer: Self-pay

## 2019-01-11 ENCOUNTER — Encounter: Payer: Self-pay | Admitting: Obstetrics & Gynecology

## 2019-01-11 ENCOUNTER — Ambulatory Visit: Payer: 59 | Admitting: Obstetrics & Gynecology

## 2019-01-11 ENCOUNTER — Other Ambulatory Visit: Payer: Self-pay

## 2019-01-11 VITALS — BP 126/84

## 2019-01-11 DIAGNOSIS — N72 Inflammatory disease of cervix uteri: Secondary | ICD-10-CM | POA: Diagnosis not present

## 2019-01-11 DIAGNOSIS — N888 Other specified noninflammatory disorders of cervix uteri: Secondary | ICD-10-CM | POA: Diagnosis not present

## 2019-01-11 DIAGNOSIS — R87610 Atypical squamous cells of undetermined significance on cytologic smear of cervix (ASC-US): Secondary | ICD-10-CM

## 2019-01-11 NOTE — Progress Notes (Signed)
    ISZABELLA TRULL January 23, 1971 [id number]        48 y.o.  G2P2002 Married  RP: ASCUS x 2, HPV HR neg, for Colposcopy  HPI: ASCUS x 2, HPV HR neg.  Last Pap ASCUS 11/25/2018.  Declines STD screen.  No pelvic pain.  Normal vaginal secretions.   OB History  Gravida Para Term Preterm AB Living  2 2 2     2   SAB TAB Ectopic Multiple Live Births          2    # Outcome Date GA Lbr Len/2nd Weight Sex Delivery Anes PTL Lv  2 Term     M Vag-Spont  N LIV  1 Term     F Vag-Spont  N LIV    Past medical history,surgical history, problem list, medications, allergies, family history and social history were all reviewed and documented in the EPIC chart.   Directed ROS with pertinent positives and negatives documented in the history of present illness/assessment and plan.  Exam:  Vitals:   01/11/19 1125  BP: 126/84   General appearance:  Normal  Colposcopy Procedure Note FELITA BRANON 01/11/2019  Indications:  ASCUS x 2, HR HPV negative for Colposcopy  Procedure Details  The risks and benefits of the procedure and Verbal informed consent obtained.  Speculum placed in vagina and excellent visualization of cervix achieved, cervix swabbed x 3 with acetic acid solution.  Findings:  Cervix colposcopy: Physical Exam Genitourinary:       Vaginal colposcopy: Normal  Vulvar colposcopy: Normal  Perirectal colposcopy: Grossly normal  The cervix was sprayed with Hurricane before performing the cervical biopsies.  Specimens:  Cervical Bx at 5 and 12 O'clock.  Complications:  None, good hemostasis with Silver Nitrate. . Plan:  Management per results   Assessment/Plan:  48 y.o. G2P2002   1. ASCUS of cervix with negative high risk HPV ASCUS on Pap test in 2019 and again March 2020.  Both times the HPV high-risk was negative.  Counseling on abnormal Pap test and HPV done.  Colposcopy procedure reviewed with patient.  Colposcopy findings discussed.  Pending cervical biopsies.   Management per results.  Other orders - Pathology Report  Genia Del MD, 12:00 PM 01/11/2019

## 2019-01-11 NOTE — Patient Instructions (Signed)
1. ASCUS of cervix with negative high risk HPV ASCUS on Pap test in 2019 and again March 2020.  Both times the HPV high-risk was negative.  Counseling on abnormal Pap test and HPV done.  Colposcopy procedure reviewed with patient.  Colposcopy findings discussed.  Pending cervical biopsies.  Management per results.  Other orders - Pathology Report  Patty Hale, it was a pleasure seeing you today!  I will inform you of your results as soon as they are available.

## 2019-01-13 LAB — TISSUE PATH REPORT

## 2019-01-13 LAB — PATHOLOGY

## 2019-01-16 ENCOUNTER — Encounter: Payer: Self-pay | Admitting: *Deleted

## 2019-05-22 ENCOUNTER — Other Ambulatory Visit: Payer: Self-pay

## 2019-05-24 ENCOUNTER — Other Ambulatory Visit: Payer: Self-pay

## 2019-05-24 ENCOUNTER — Encounter: Payer: Self-pay | Admitting: Women's Health

## 2019-05-24 ENCOUNTER — Ambulatory Visit: Payer: 59 | Admitting: Women's Health

## 2019-05-24 VITALS — BP 110/78

## 2019-05-24 DIAGNOSIS — L739 Follicular disorder, unspecified: Secondary | ICD-10-CM

## 2019-05-24 NOTE — Progress Notes (Signed)
48 year old M BF G2, P2 presents with complaint of bump on right inner thigh for the past 2 weeks which is  getting smaller.  States it is painful to touch and has never had something like this before.  Denies vaginal discharge, urinary symptoms, abdominal pain, or fever.  05/2018 Mirena IUD rare bleeding.  No known health problems.  Annual screening mammogram due.  Exam: Appears well.  Right upper inner thigh 2 cm folliculitis non indurated.  Resolving folliculitis  Plan: Loose clothing, open to air as able, triple antibiotic ointment twice daily, warm soaks, reviewed most likely will resolve on its own.  Instructed to call if continued problems, reassurance given.

## 2019-05-24 NOTE — Patient Instructions (Signed)

## 2019-06-21 ENCOUNTER — Other Ambulatory Visit: Payer: Self-pay | Admitting: Obstetrics & Gynecology

## 2019-06-21 DIAGNOSIS — Z1231 Encounter for screening mammogram for malignant neoplasm of breast: Secondary | ICD-10-CM

## 2019-07-07 ENCOUNTER — Other Ambulatory Visit: Payer: Self-pay

## 2019-07-07 ENCOUNTER — Ambulatory Visit
Admission: RE | Admit: 2019-07-07 | Discharge: 2019-07-07 | Disposition: A | Discharge status: Home / Self Care | Payer: 59 | Source: Ambulatory Visit | Attending: Obstetrics & Gynecology | Admitting: Obstetrics & Gynecology

## 2019-07-07 DIAGNOSIS — Z1231 Encounter for screening mammogram for malignant neoplasm of breast: Secondary | ICD-10-CM

## 2019-07-11 ENCOUNTER — Other Ambulatory Visit: Payer: Self-pay

## 2019-07-12 ENCOUNTER — Encounter: Payer: Self-pay | Admitting: Obstetrics & Gynecology

## 2019-07-12 ENCOUNTER — Ambulatory Visit: Payer: 59 | Admitting: Obstetrics & Gynecology

## 2019-07-12 VITALS — BP 120/70

## 2019-07-12 DIAGNOSIS — R87610 Atypical squamous cells of undetermined significance on cytologic smear of cervix (ASC-US): Secondary | ICD-10-CM

## 2019-07-12 DIAGNOSIS — Z975 Presence of (intrauterine) contraceptive device: Secondary | ICD-10-CM | POA: Diagnosis not present

## 2019-07-12 NOTE — Addendum Note (Signed)
Addended by: Thurnell Garbe A on: 07/12/2019 04:38 PM   Modules accepted: Orders

## 2019-07-12 NOTE — Patient Instructions (Signed)
1. ASCUS of cervix with negative high risk HPV ASCUS on Pap x2, last one in February 2020.Marland Kitchen  HPV high-risk negative.  Colposcopy March 2020 showed no dysplasia.  Repeat Pap test done today.  If normal, patient will resume annual exams.  2. Uses hormone releasing intrauterine device (IUD) for contraception Well on Mirena IUD since August 2019.  Good IUD location confirmed by ultrasound in September 2019.  Amore, it was a pleasure seeing you today!  I will inform you of your results as soon as they are available.

## 2019-07-12 NOTE — Progress Notes (Signed)
    Patty Hale 07/20/1971 [id number]        48 y.o.  G2P2002  Married  RP: Repeat Pap test at 6 months  HPI: Well on Mirena IUD.  H/O ASCUS x 2 with HPV HR negative, last 11/2018.  Colpo 12/2018 No dysplasia.  Well on Mirena IUD x 05/2018.  Mammo negative 06/2019.   OB History  Gravida Para Term Preterm AB Living  2 2 2     2   SAB TAB Ectopic Multiple Live Births          2    # Outcome Date GA Lbr Len/2nd Weight Sex Delivery Anes PTL Lv  2 Term     M Vag-Spont  N LIV  1 Term     F Vag-Spont  N LIV    Past medical history,surgical history, problem list, medications, allergies, family history and social history were all reviewed and documented in the EPIC chart.   Directed ROS with pertinent positives and negatives documented in the history of present illness/assessment and plan.  Exam:  Vitals:   07/12/19 0830  BP: 120/70   General appearance:  Normal  Abdomen: Normal  Gynecologic exam: Vulva normal.  Speculum: Cervix and vagina normal.  Normal vaginal secretions.  Pap reflex done.  IUD strings still not visible, good IUD location confirmed by ultrasound in September 2019.   Assessment/Plan:  48 y.o. G2P2002   1. ASCUS of cervix with negative high risk HPV ASCUS on Pap x2, last one in February 2020.Marland Kitchen  HPV high-risk negative.  Colposcopy March 2020 showed no dysplasia.  Repeat Pap test done today.  If normal, patient will resume annual exams.  2. Uses hormone releasing intrauterine device (IUD) for contraception Well on Mirena IUD since August 2019.  Good IUD location confirmed by ultrasound in September 2019.  Counseling on above issues and coordination of care more than 50% for 15 minutes.  Princess Bruins MD, 8:43 AM 07/12/2019

## 2019-07-14 LAB — PAP IG W/ RFLX HPV ASCU

## 2019-09-07 ENCOUNTER — Other Ambulatory Visit: Payer: Self-pay

## 2019-09-07 DIAGNOSIS — Z20822 Contact with and (suspected) exposure to covid-19: Secondary | ICD-10-CM

## 2019-09-11 LAB — NOVEL CORONAVIRUS, NAA: SARS-CoV-2, NAA: DETECTED — AB

## 2019-09-12 ENCOUNTER — Telehealth: Payer: Self-pay | Admitting: Critical Care Medicine

## 2019-09-12 NOTE — Telephone Encounter (Signed)
I  Spoken to this pt who was covid Positive on 11/22.  She had HA and is better.  She knows health dept may be in touch

## 2019-10-20 HISTORY — PX: KNEE SURGERY: SHX244

## 2020-01-02 ENCOUNTER — Encounter: Payer: 59 | Admitting: Obstetrics & Gynecology

## 2020-02-14 ENCOUNTER — Other Ambulatory Visit: Payer: Self-pay

## 2020-02-15 ENCOUNTER — Encounter: Payer: Self-pay | Admitting: *Deleted

## 2020-02-15 ENCOUNTER — Ambulatory Visit (INDEPENDENT_AMBULATORY_CARE_PROVIDER_SITE_OTHER): Payer: 59 | Admitting: Obstetrics & Gynecology

## 2020-02-15 ENCOUNTER — Encounter: Payer: Self-pay | Admitting: Obstetrics & Gynecology

## 2020-02-15 VITALS — BP 122/80 | Ht 66.0 in | Wt 177.0 lb

## 2020-02-15 DIAGNOSIS — R87610 Atypical squamous cells of undetermined significance on cytologic smear of cervix (ASC-US): Secondary | ICD-10-CM

## 2020-02-15 DIAGNOSIS — Z30431 Encounter for routine checking of intrauterine contraceptive device: Secondary | ICD-10-CM | POA: Diagnosis not present

## 2020-02-15 DIAGNOSIS — Z01419 Encounter for gynecological examination (general) (routine) without abnormal findings: Secondary | ICD-10-CM | POA: Diagnosis not present

## 2020-02-15 LAB — COMPREHENSIVE METABOLIC PANEL
AG Ratio: 2 (calc) (ref 1.0–2.5)
ALT: 32 U/L — ABNORMAL HIGH (ref 6–29)
AST: 25 U/L (ref 10–35)
Albumin: 4.7 g/dL (ref 3.6–5.1)
Alkaline phosphatase (APISO): 93 U/L (ref 37–153)
BUN/Creatinine Ratio: 20 (calc) (ref 6–22)
BUN: 26 mg/dL — ABNORMAL HIGH (ref 7–25)
CO2: 25 mmol/L (ref 20–32)
Calcium: 10.2 mg/dL (ref 8.6–10.4)
Chloride: 104 mmol/L (ref 98–110)
Creat: 1.33 mg/dL — ABNORMAL HIGH (ref 0.50–1.05)
Globulin: 2.4 g/dL (calc) (ref 1.9–3.7)
Glucose, Bld: 94 mg/dL (ref 65–99)
Potassium: 4.8 mmol/L (ref 3.5–5.3)
Sodium: 139 mmol/L (ref 135–146)
Total Bilirubin: 0.5 mg/dL (ref 0.2–1.2)
Total Protein: 7.1 g/dL (ref 6.1–8.1)

## 2020-02-15 LAB — CBC
HCT: 44.1 % (ref 35.0–45.0)
Hemoglobin: 14.1 g/dL (ref 11.7–15.5)
MCH: 28.3 pg (ref 27.0–33.0)
MCHC: 32 g/dL (ref 32.0–36.0)
MCV: 88.6 fL (ref 80.0–100.0)
MPV: 11.9 fL (ref 7.5–12.5)
Platelets: 197 10*3/uL (ref 140–400)
RBC: 4.98 10*6/uL (ref 3.80–5.10)
RDW: 12.5 % (ref 11.0–15.0)
WBC: 4.5 10*3/uL (ref 3.8–10.8)

## 2020-02-15 LAB — LIPID PANEL
Cholesterol: 182 mg/dL (ref <200)
HDL: 59 mg/dL (ref >=50)
LDL Cholesterol (Calc): 109 mg/dL (calc) — ABNORMAL HIGH
Non-HDL Cholesterol (Calc): 123 mg/dL (calc) (ref <130)
Total CHOL/HDL Ratio: 3.1 (calc) (ref <5.0)
Triglycerides: 55 mg/dL (ref <150)

## 2020-02-15 LAB — TSH: TSH: 2.73 mIU/L

## 2020-02-15 LAB — VITAMIN D 25 HYDROXY (VIT D DEFICIENCY, FRACTURES): Vit D, 25-Hydroxy: 32 ng/mL (ref 30–100)

## 2020-02-15 NOTE — Progress Notes (Signed)
Patty Hale 1971-03-11 [id number]   History:    49 y.o.  G2P2L2 Married  RP:  Established patient presenting for annual gyn exam   HPI: Well on Mirena IUD since August 2019.  Strings not visible at the exocervix so a pelvic ultrasound was done September 2019 confirming good intra-uterine location of the IUD.  No menstrual periods and no breakthrough bleeding.  No pelvic pain.  Rarely sexually active.  No pain with intercourse.  Breasts normal.  Body mass index 28.57.  Lost weight since last year on a low calorie diet.  Needs to increase physical activity.  Fasting health labs here today.   Past medical history,surgical history, family history and social history were all reviewed and documented in the EPIC chart.  Gynecologic History No LMP recorded. (Menstrual status: IUD).   Obstetric History OB History  Gravida Para Term Preterm AB Living  '2 2 2     2  ' SAB TAB Ectopic Multiple Live Births          2    # Outcome Date GA Lbr Len/2nd Weight Sex Delivery Anes PTL Lv  2 Term     M Vag-Spont  N LIV  1 Term     F Vag-Spont  N LIV     ROS: A ROS was performed and pertinent positives and negatives are included in the history.  GENERAL: No fevers or chills. HEENT: No change in vision, no earache, sore throat or sinus congestion. NECK: No pain or stiffness. CARDIOVASCULAR: No chest pain or pressure. No palpitations. PULMONARY: No shortness of breath, cough or wheeze. GASTROINTESTINAL: No abdominal pain, nausea, vomiting or diarrhea, melena or bright red blood per rectum. GENITOURINARY: No urinary frequency, urgency, hesitancy or dysuria. MUSCULOSKELETAL: No joint or muscle pain, no back pain, no recent trauma. DERMATOLOGIC: No rash, no itching, no lesions. ENDOCRINE: No polyuria, polydipsia, no heat or cold intolerance. No recent change in weight. HEMATOLOGICAL: No anemia or easy bruising or bleeding. NEUROLOGIC: No headache, seizures, numbness, tingling or weakness. PSYCHIATRIC: No  depression, no loss of interest in normal activity or change in sleep pattern.     Exam:   BP 122/80 (BP Location: Right Arm, Patient Position: Sitting, Cuff Size: Normal)   Ht '5\' 6"'  (1.676 m)   Wt 177 lb (80.3 kg)   BMI 28.57 kg/m   Body mass index is 28.57 kg/m.  General appearance : Well developed well nourished female. No acute distress HEENT: Eyes: no retinal hemorrhage or exudates,  Neck supple, trachea midline, no carotid bruits, no thyroidmegaly Lungs: Clear to auscultation, no rhonchi or wheezes, or rib retractions  Heart: Regular rate and rhythm, no murmurs or gallops Breast:Examined in sitting and supine position were symmetrical in appearance, no palpable masses or tenderness,  no skin retraction, no nipple inversion, no nipple discharge, no skin discoloration, no axillary or supraclavicular lymphadenopathy Abdomen: no palpable masses or tenderness, no rebound or guarding Extremities: no edema or skin discoloration or tenderness  Pelvic: Vulva: Normal             Vagina: No gross lesions or discharge  Cervix: No gross lesions or discharge. Pap reflex done.  IUD strings short, but felt at the [facility].    Uterus  AV, normal size, shape and consistency, non-tender and mobile  Adnexa  Without masses or tenderness  Anus: Normal   Assessment/Plan:  49 y.o. female for annual exam   1. Encounter for routine gynecological examination with Papanicolaou smear of cervix  Normal gynecologic exam.  Pap reflex done today.  Breast exam normal.  Screening mammogram September 2020 was negative.  Improved body mass index at 28.57.  Continue on a low calorie/carb diet and fitness.  Health labs here today. - CBC - Comp Met (CMET) - Lipid panel - TSH - VITAMIN D 25 Hydroxy (Vit-D Deficiency, Fractures)  2. ASCUS of cervix with negative high risk HPV ASCUS x 2.  Colpo Negative 12/2018.  Last Pap test 06/2019 Negative.  3. Encounter for routine checking of intrauterine contraceptive device  (IUD) Mirena IUD well-tolerated and in good position.  Other orders - Multiple Vitamins-Minerals (ZINC PO); Take 1 tablet by mouth daily. - Potassium (POTASSIMIN PO); Take 1 tablet by mouth daily.  Princess Bruins MD, 8:42 AM 02/15/2020

## 2020-02-16 ENCOUNTER — Encounter: Payer: Self-pay | Admitting: Obstetrics & Gynecology

## 2020-02-16 NOTE — Patient Instructions (Signed)
1. Encounter for routine gynecological examination with Papanicolaou smear of cervix Normal gynecologic exam.  Pap reflex done today.  Breast exam normal.  Screening mammogram September 2020 was negative.  Improved body mass index at 28.57.  Continue on a low calorie/carb diet and fitness.  Health labs here today. - CBC - Comp Met (CMET) - Lipid panel - TSH - VITAMIN D 25 Hydroxy (Vit-D Deficiency, Fractures)  2. ASCUS of cervix with negative high risk HPV ASCUS x 2.  Colpo Negative 12/2018.  Last Pap test 06/2019 Negative.  3. Encounter for routine checking of intrauterine contraceptive device (IUD) Mirena IUD well-tolerated and in good position.  Other orders - Multiple Vitamins-Minerals (ZINC PO); Take 1 tablet by mouth daily. - Potassium (POTASSIMIN PO); Take 1 tablet by mouth daily.  Chalisa, it was a pleasure seeing you today!  I will inform you of your results as soon as they are available.

## 2020-02-19 LAB — PAP IG W/ RFLX HPV ASCU

## 2020-03-25 ENCOUNTER — Other Ambulatory Visit: Payer: Self-pay | Admitting: Orthopedic Surgery

## 2020-03-25 DIAGNOSIS — S83249S Other tear of medial meniscus, current injury, unspecified knee, sequela: Secondary | ICD-10-CM

## 2020-04-23 ENCOUNTER — Other Ambulatory Visit: Payer: Self-pay

## 2020-04-23 ENCOUNTER — Ambulatory Visit
Admission: RE | Admit: 2020-04-23 | Discharge: 2020-04-23 | Disposition: A | Discharge status: Home / Self Care | Payer: 59 | Source: Ambulatory Visit | Attending: Orthopedic Surgery | Admitting: Orthopedic Surgery

## 2020-04-23 DIAGNOSIS — S83249S Other tear of medial meniscus, current injury, unspecified knee, sequela: Secondary | ICD-10-CM

## 2020-07-23 ENCOUNTER — Other Ambulatory Visit: Payer: Self-pay | Admitting: Obstetrics & Gynecology

## 2020-07-23 DIAGNOSIS — Z1231 Encounter for screening mammogram for malignant neoplasm of breast: Secondary | ICD-10-CM

## 2020-07-29 ENCOUNTER — Ambulatory Visit
Admission: RE | Admit: 2020-07-29 | Discharge: 2020-07-29 | Disposition: A | Discharge status: Home / Self Care | Payer: 59 | Source: Ambulatory Visit | Attending: Obstetrics & Gynecology | Admitting: Obstetrics & Gynecology

## 2020-07-29 ENCOUNTER — Other Ambulatory Visit: Payer: Self-pay

## 2020-07-29 DIAGNOSIS — Z1231 Encounter for screening mammogram for malignant neoplasm of breast: Secondary | ICD-10-CM

## 2020-10-03 ENCOUNTER — Other Ambulatory Visit: Payer: Self-pay

## 2020-10-03 ENCOUNTER — Encounter: Payer: Self-pay | Admitting: Sports Medicine

## 2020-10-03 ENCOUNTER — Ambulatory Visit (INDEPENDENT_AMBULATORY_CARE_PROVIDER_SITE_OTHER): Payer: 59

## 2020-10-03 ENCOUNTER — Ambulatory Visit: Payer: 59 | Admitting: Sports Medicine

## 2020-10-03 DIAGNOSIS — M79671 Pain in right foot: Secondary | ICD-10-CM | POA: Diagnosis not present

## 2020-10-03 DIAGNOSIS — K219 Gastro-esophageal reflux disease without esophagitis: Secondary | ICD-10-CM | POA: Insufficient documentation

## 2020-10-03 DIAGNOSIS — F419 Anxiety disorder, unspecified: Secondary | ICD-10-CM | POA: Insufficient documentation

## 2020-10-03 DIAGNOSIS — N390 Urinary tract infection, site not specified: Secondary | ICD-10-CM | POA: Insufficient documentation

## 2020-10-03 DIAGNOSIS — M79673 Pain in unspecified foot: Secondary | ICD-10-CM | POA: Diagnosis not present

## 2020-10-03 DIAGNOSIS — M79672 Pain in left foot: Secondary | ICD-10-CM

## 2020-10-03 DIAGNOSIS — M069 Rheumatoid arthritis, unspecified: Secondary | ICD-10-CM | POA: Diagnosis not present

## 2020-10-03 DIAGNOSIS — R899 Unspecified abnormal finding in specimens from other organs, systems and tissues: Secondary | ICD-10-CM | POA: Insufficient documentation

## 2020-10-03 DIAGNOSIS — M779 Enthesopathy, unspecified: Secondary | ICD-10-CM

## 2020-10-03 DIAGNOSIS — F410 Panic disorder [episodic paroxysmal anxiety] without agoraphobia: Secondary | ICD-10-CM | POA: Insufficient documentation

## 2020-10-03 DIAGNOSIS — K5900 Constipation, unspecified: Secondary | ICD-10-CM | POA: Insufficient documentation

## 2020-10-03 DIAGNOSIS — M722 Plantar fascial fibromatosis: Secondary | ICD-10-CM

## 2020-10-03 DIAGNOSIS — R202 Paresthesia of skin: Secondary | ICD-10-CM | POA: Insufficient documentation

## 2020-10-03 MED ORDER — PREDNISONE 10 MG (21) PO TBPK: ORAL_TABLET | ORAL | 0 refills | Status: DC

## 2020-10-03 NOTE — Progress Notes (Signed)
Subjective: Patty Hale is a 49 y.o. female patient presents to office with complaint of moderate heel pain on the left and right. Patient admits to post static dyskinesia for several months since the summer in duration. Patient has treated this problem with stretching, ice, good shoes and massage gun with no relief. Denies any other pedal complaints.   Review of Systems  All other systems reviewed and are negative.    Patient Active Problem List   Diagnosis Date Noted  . Anxiety 10/03/2020  . Constipation 10/03/2020  . Gastroesophageal reflux disease without esophagitis 10/03/2020  . Panic attack 10/03/2020  . Paresthesia 10/03/2020  . Unspecified abnormal finding in specimens from other organs, systems and tissues 10/03/2020  . Urinary tract infectious disease 10/03/2020  . Vitamin D deficiency 11/16/2016  . Radial scar of breast 03/21/2015  . Dense breast tissue 03/21/2015  . IUD (intrauterine device) in place 10/11/2013  . Mastodynia 01/25/2013  . Hx gestational diabetes 10/05/2011    Current Outpatient Medications on File Prior to Visit  Medication Sig Dispense Refill  . ALPRAZolam (XANAX) 0.25 MG tablet Take 0.25 mg by mouth daily as needed.    . Ascorbic Acid (VITAMIN C) 500 MG CHEW 2 tablets    . cholecalciferol (VITAMIN D3) 25 MCG (1000 UT) tablet Take 1,000 Units by mouth daily.    Marland Kitchen HYDROcodone-acetaminophen (NORCO/VICODIN) 5-325 MG tablet Take 1 tablet by mouth every 4 (four) hours as needed.    Marland Kitchen ibuprofen (ADVIL) 800 MG tablet Take 800 mg by mouth 3 (three) times daily as needed.    Marland Kitchen levonorgestrel (MIRENA) 20 MCG/24HR IUD 1 each by Intrauterine route once.    . meloxicam (MOBIC) 15 MG tablet Take 15 mg by mouth daily.    . methocarbamol (ROBAXIN) 500 MG tablet Take 500 mg by mouth every 6 (six) hours as needed.    . Multiple Vitamins-Minerals (ZINC PO) Take 1 tablet by mouth daily.    . Potassium (POTASSIMIN PO) Take 1 tablet by mouth daily.    . vitamin E 400  UNIT capsule Take 400 Units by mouth daily.     No current facility-administered medications on file prior to visit.    No Known Allergies  Objective: Physical Exam General: The patient is alert and oriented x3 in no acute distress.  Dermatology: Skin is warm, dry and supple bilateral lower extremities. Nails 1-10 are normal. There is no erythema, edema, no eccymosis, no open lesions present. Integument is otherwise unremarkable.  Vascular: Dorsalis Pedis pulse and Posterior Tibial pulse are 2/4 bilateral. Capillary fill time is immediate to all digits.  Neurological: Grossly intact to light touch with an achilles bilateral.   Musculoskeletal: Tenderness to palpation at the medial calcaneal tubercale and through the insertion of the plantar fascia on the left and right foot extending into the arch and to backs of both heels at achilles. No pain with compression of calcaneus bilateral. No pain with tuning fork to calcaneus bilateral. No pain with calf compression bilateral. There is decreased Ankle joint range of motion bilateral. All other joints range of motion within normal limits bilateral. Strength 5/5 in all groups bilateral. Pes planus foot type.   Gait: Unassisted, Antalgic avoid weight on heels  Xray, Right/Left foot:  Normal osseous mineralization. Joint spaces preserved. No fracture/dislocation/boney destruction. Calcaneal spur present with mild thickening of plantar fascia. No other soft tissue abnormalities or radiopaque foreign bodies.   Assessment and Plan: Problem List Items Addressed This Visit   None  Visit Diagnoses    Pain in right foot    -  Primary   Relevant Orders   DG Foot Complete Right   Pain in left foot       Relevant Orders   DG Foot Complete Left   Plantar fasciitis, bilateral       Foot arch pain, unspecified laterality       Tendonitis          -Complete examination performed.  -Xrays reviewed -Discussed with patient in detail the condition  of plantar fasciitis and tendonitis, how this occurs and general treatment options. Explained both conservative and surgical treatments.  -No injection due to diffuse pain all over  -Rx Prednisone -Take mobic which she has at home -Recommended good supportive shoes and advised use of heel lifts  - Explained in detail the use of the night splint which was dispensed at today's visit. -Explained and dispensed to patient daily stretching exercises. -Recommend patient to ice affected area 1-2x daily. -Patient to return to office in 3-4 weeks for follow up or sooner if problems or questions arise.  Asencion Islam, DPM

## 2020-10-03 NOTE — Patient Instructions (Signed)
Plantar Fasciitis Rehab Ask your health care provider which exercises are safe for you. Do exercises exactly as told by your health care provider and adjust them as directed. It is normal to feel mild stretching, pulling, tightness, or discomfort as you do these exercises. Stop right away if you feel sudden pain or your pain gets worse. Do not begin these exercises until told by your health care provider. Stretching and range-of-motion exercises These exercises warm up your muscles and joints and improve the movement and flexibility of your foot. These exercises also help to relieve pain. Plantar fascia stretch  1. Sit with your left / right leg crossed over your opposite knee. 2. Hold your heel with one hand with that thumb near your arch. With your other hand, hold your toes and gently pull them back toward the top of your foot. You should feel a stretch on the bottom of your toes or your foot (plantar fascia) or both. 3. Hold this stretch for__________ seconds. 4. Slowly release your toes and return to the starting position. Repeat __________ times. Complete this exercise __________ times a day. Gastrocnemius stretch, standing This exercise is also called a calf (gastroc) stretch. It stretches the muscles in the back of the upper calf. 1. Stand with your hands against a wall. 2. Extend your left / right leg behind you, and bend your front knee slightly. 3. Keeping your heels on the floor and your back knee straight, shift your weight toward the wall. Do not arch your back. You should feel a gentle stretch in your upper left / right calf. 4. Hold this position for __________ seconds. Repeat __________ times. Complete this exercise __________ times a day. Soleus stretch, standing This exercise is also called a calf (soleus) stretch. It stretches the muscles in the back of the lower calf. 1. Stand with your hands against a wall. 2. Extend your left / right leg behind you, and bend your front  knee slightly. 3. Keeping your heels on the floor, bend your back knee and shift your weight slightly over your back leg. You should feel a gentle stretch deep in your lower calf. 4. Hold this position for __________ seconds. Repeat __________ times. Complete this exercise __________ times a day. Gastroc and soleus stretch, standing step This exercise stretches the muscles in the back of the lower leg. These muscles are in the upper calf (gastrocnemius) and the lower calf (soleus). 1. Stand with the ball of your left / right foot on a step. The ball of your foot is on the walking surface, right under your toes. 2. Keep your other foot firmly on the same step. 3. Hold on to the wall or a railing for balance. 4. Slowly lift your other foot, allowing your body weight to press your left / right heel down over the edge of the step. You should feel a stretch in your left / right calf. 5. Hold this position for __________ seconds. 6. Return both feet to the step. 7. Repeat this exercise with a slight bend in your left / right knee. Repeat __________ times with your left / right knee straight and __________ times with your left / right knee bent. Complete this exercise __________ times a day. Balance exercise This exercise builds your balance and strength control of your arch to help take pressure off your plantar fascia. Single leg stand If this exercise is too easy, you can try it with your eyes closed or while standing on a pillow. 1.   Without shoes, stand near a railing or in a doorway. You may hold on to the railing or door frame as needed. 2. Stand on your left / right foot. Keep your big toe down on the floor and try to keep your arch lifted. Do not let your foot roll inward. 3. Hold this position for __________ seconds. Repeat __________ times. Complete this exercise __________ times a day. This information is not intended to replace advice given to you by your health care provider. Make sure  you discuss any questions you have with your health care provider. Document Revised: 01/26/2019 Document Reviewed: 08/03/2018 Elsevier Patient Education  2020 [employer]  

## 2020-10-24 ENCOUNTER — Ambulatory Visit: Payer: 59 | Admitting: Sports Medicine

## 2020-10-24 ENCOUNTER — Encounter: Payer: Self-pay | Admitting: Sports Medicine

## 2020-10-24 ENCOUNTER — Other Ambulatory Visit: Payer: Self-pay

## 2020-10-24 DIAGNOSIS — M79671 Pain in right foot: Secondary | ICD-10-CM

## 2020-10-24 DIAGNOSIS — M79673 Pain in unspecified foot: Secondary | ICD-10-CM

## 2020-10-24 DIAGNOSIS — M79672 Pain in left foot: Secondary | ICD-10-CM

## 2020-10-24 DIAGNOSIS — M722 Plantar fascial fibromatosis: Secondary | ICD-10-CM | POA: Diagnosis not present

## 2020-10-24 DIAGNOSIS — R252 Cramp and spasm: Secondary | ICD-10-CM

## 2020-10-24 DIAGNOSIS — M216X1 Other acquired deformities of right foot: Secondary | ICD-10-CM

## 2020-10-24 DIAGNOSIS — M069 Rheumatoid arthritis, unspecified: Secondary | ICD-10-CM | POA: Diagnosis not present

## 2020-10-24 DIAGNOSIS — M216X2 Other acquired deformities of left foot: Secondary | ICD-10-CM

## 2020-10-24 DIAGNOSIS — M779 Enthesopathy, unspecified: Secondary | ICD-10-CM

## 2020-10-24 MED ORDER — TRIAMCINOLONE ACETONIDE 10 MG/ML IJ SUSP
10.0000 mg | Freq: Once | INTRAMUSCULAR | Status: AC
  Administered 2020-10-24: 10 mg

## 2020-10-24 NOTE — Progress Notes (Signed)
Subjective: Patty Hale is a 50 y.o. female patient returns office for follow-up evaluation of bilateral foot pain.  Patient reports that when she was on prednisone the pain went away completely and after she finished the medication and slowly the pain started back.  Patient reports that stretching helps but is only temporary states that her night splint seems to help but still has pain with first few steps out of bed in the morning after sitting for a while across her feet with significant cramping.  Patient denies any significant redness warmth swelling drainage or any other acute signs or symptoms at this time.   Patient Active Problem List   Diagnosis Date Noted  . Anxiety 10/03/2020  . Constipation 10/03/2020  . Gastroesophageal reflux disease without esophagitis 10/03/2020  . Panic attack 10/03/2020  . Paresthesia 10/03/2020  . Unspecified abnormal finding in specimens from other organs, systems and tissues 10/03/2020  . Urinary tract infectious disease 10/03/2020  . Vitamin D deficiency 11/16/2016  . Radial scar of breast 03/21/2015  . Dense breast tissue 03/21/2015  . IUD (intrauterine device) in place 10/11/2013  . Mastodynia 01/25/2013  . Hx gestational diabetes 10/05/2011    Current Outpatient Medications on File Prior to Visit  Medication Sig Dispense Refill  . ALPRAZolam (XANAX) 0.25 MG tablet Take 0.25 mg by mouth daily as needed.    . Ascorbic Acid (VITAMIN C) 500 MG CHEW 2 tablets    . azithromycin (ZITHROMAX) 250 MG tablet Take 250 mg by mouth as directed.    . benzonatate (TESSALON) 100 MG capsule Take 100 mg by mouth 3 (three) times daily.    . cholecalciferol (VITAMIN D3) 25 MCG (1000 UT) tablet Take 1,000 Units by mouth daily.    Marland Kitchen ibuprofen (ADVIL) 800 MG tablet Take 800 mg by mouth 3 (three) times daily as needed.    Marland Kitchen levonorgestrel (MIRENA) 20 MCG/24HR IUD 1 each by Intrauterine route once.    . meloxicam (MOBIC) 15 MG tablet Take 15 mg by mouth daily.    .  methocarbamol (ROBAXIN) 500 MG tablet Take 500 mg by mouth every 6 (six) hours as needed.    . Multiple Vitamins-Minerals (ZINC PO) Take 1 tablet by mouth daily.    . Potassium (POTASSIMIN PO) Take 1 tablet by mouth daily.    . predniSONE (STERAPRED UNI-PAK 21 TAB) 10 MG (21) TBPK tablet Take as directed 21 tablet 0  . vitamin E 400 UNIT capsule Take 400 Units by mouth daily.     No current facility-administered medications on file prior to visit.    No Known Allergies  Objective: Physical Exam General: The patient is alert and oriented x3 in no acute distress.  Dermatology: Skin is warm, dry and supple bilateral lower extremities. Nails 1-10 are normal. There is no erythema, edema, no eccymosis, no open lesions present. Integument is otherwise unremarkable.  Vascular: Dorsalis Pedis pulse and Posterior Tibial pulse are 2/4 bilateral. Capillary fill time is immediate to all digits.  Neurological: Grossly intact to light touch with an achilles bilateral.  Subjective cramping.  Musculoskeletal: Tenderness to palpation at the medial calcaneal tubercale and through the insertion of the plantar fascia on the left and right foot extending into the arch and to backs of both heels at achilles. No pain with compression of calcaneus bilateral. No pain with tuning fork to calcaneus bilateral. No pain with calf compression bilateral. There is decreased Ankle joint range of motion bilateral. All other joints range of motion within  normal limits bilateral. Strength 5/5 in all groups bilateral. Pes planus foot type.   Assessment and Plan: Problem List Items Addressed This Visit   None   Visit Diagnoses    Rheumatoid arthritis involving both feet, unspecified whether rheumatoid factor present ([place])    -  Primary   Relevant Orders   C-reactive protein   CBC with Differential/Platelet   HLA-B27 antigen   Rheumatoid factor   ANA   Uric acid   Sedimentation rate   Plantar fasciitis, bilateral        Foot arch pain, unspecified laterality       Tendonitis       Pain in right foot       Pain in left foot       Cramping of feet       Acquired equinus deformity of both feet         -Complete examination performed.  -Re-Discussed with patient in detail the condition of plantar fasciitis and tendonitis with cramping -After oral consent and aseptic prep, injected a mixture containing 1 ml of 2% plain lidocaine, 1 ml 0.5% plain marcaine, 0.5 ml of kenalog 10 and 0.5 ml of dexamethasone phosphate into right and left heel at plantar fascia insertion without complication. Post-injection care discussed with patient.  -Continue with night splint and daily stretching -Rx arthrthic panel  -Continue with good supportive shoes daily  -Recommend tonic water for crams -Return after blood work or sooner if problems or issues arise.   Landis Martins, DPM

## 2020-10-28 LAB — CBC WITH DIFFERENTIAL/PLATELET
Absolute Monocytes: 258 cells/uL (ref 200–950)
Basophils Absolute: 9 cells/uL (ref 0–200)
Basophils Relative: 0.2 %
Eosinophils Absolute: 30 cells/uL (ref 15–500)
Eosinophils Relative: 0.7 %
HCT: 40.5 % (ref 35.0–45.0)
Hemoglobin: 13.1 g/dL (ref 11.7–15.5)
Lymphs Abs: 2559 cells/uL (ref 850–3900)
MCH: 27.8 pg (ref 27.0–33.0)
MCHC: 32.3 g/dL (ref 32.0–36.0)
MCV: 85.8 fL (ref 80.0–100.0)
MPV: 11.6 fL (ref 7.5–12.5)
Monocytes Relative: 6 %
Neutro Abs: 1445 cells/uL — ABNORMAL LOW (ref 1500–7800)
Neutrophils Relative %: 33.6 %
Platelets: 196 10*3/uL (ref 140–400)
RBC: 4.72 10*6/uL (ref 3.80–5.10)
RDW: 13.4 % (ref 11.0–15.0)
Total Lymphocyte: 59.5 %
WBC: 4.3 10*3/uL (ref 3.8–10.8)

## 2020-10-28 LAB — URIC ACID: Uric Acid, Serum: 4.8 mg/dL (ref 2.5–7.0)

## 2020-10-28 LAB — RHEUMATOID FACTOR: Rheumatoid fact SerPl-aCnc: 16 IU/mL — ABNORMAL HIGH (ref <14)

## 2020-10-28 LAB — ANA: Anti Nuclear Antibody (ANA): NEGATIVE

## 2020-10-28 LAB — HLA-B27 ANTIGEN: HLA-B27 Antigen: NEGATIVE

## 2020-10-28 LAB — C-REACTIVE PROTEIN: CRP: 0.5 mg/L (ref <8.0)

## 2020-10-28 LAB — SEDIMENTATION RATE: Sed Rate: 9 mm/h (ref 0–30)

## 2020-11-06 ENCOUNTER — Telehealth: Payer: Self-pay | Admitting: *Deleted

## 2020-11-06 NOTE — Telephone Encounter (Signed)
-----   Message from Asencion Islam, [place] sent at 11/04/2020 11:59 AM EST ----- Will you let patient know since she has not reviewed her MyChart message I sent to her regarding her results: Patty Hale I have reviewed your bloodwork and most significantly your Rheumatoid Factor is elevated. I recommend at this time you follow up with your PCP or we can refer you to a Rheumatologist. This may help to explain why your feet hurt and the inflammation that you are dealing with. -Dr. Marylene Land

## 2020-11-06 NOTE — Telephone Encounter (Signed)
Called and spoke with the patient and relayed the message per Dr Stover. Kaydan Wong °

## 2020-12-11 ENCOUNTER — Other Ambulatory Visit: Payer: Self-pay | Admitting: Sports Medicine

## 2020-12-11 DIAGNOSIS — M069 Rheumatoid arthritis, unspecified: Secondary | ICD-10-CM

## 2021-02-17 ENCOUNTER — Ambulatory Visit: Payer: 59 | Admitting: Obstetrics & Gynecology

## 2021-03-20 ENCOUNTER — Ambulatory Visit: Payer: 59 | Admitting: Obstetrics & Gynecology

## 2021-05-09 ENCOUNTER — Ambulatory Visit: Payer: 59 | Admitting: Obstetrics & Gynecology

## 2021-08-06 ENCOUNTER — Other Ambulatory Visit: Payer: Self-pay | Admitting: Obstetrics & Gynecology

## 2021-08-06 DIAGNOSIS — Z1231 Encounter for screening mammogram for malignant neoplasm of breast: Secondary | ICD-10-CM

## 2021-08-26 ENCOUNTER — Encounter: Payer: Self-pay | Admitting: Obstetrics & Gynecology

## 2021-08-26 ENCOUNTER — Other Ambulatory Visit: Payer: Self-pay

## 2021-08-26 ENCOUNTER — Ambulatory Visit (INDEPENDENT_AMBULATORY_CARE_PROVIDER_SITE_OTHER): Payer: 59 | Admitting: Obstetrics & Gynecology

## 2021-08-26 VITALS — BP 112/62 | HR 72 | Resp 16 | Ht 66.25 in | Wt 165.0 lb

## 2021-08-26 DIAGNOSIS — Z30431 Encounter for routine checking of intrauterine contraceptive device: Secondary | ICD-10-CM

## 2021-08-26 DIAGNOSIS — Z01419 Encounter for gynecological examination (general) (routine) without abnormal findings: Secondary | ICD-10-CM

## 2021-08-26 NOTE — Progress Notes (Signed)
Patty Hale [birth year] [id number]   History:    50 y.o. G2P2L2 Separated x 2 years   RP:  Established patient presenting for annual gyn exam    HPI: Well on Mirena IUD since August 2019.  Strings not visible at the exocervix so a pelvic ultrasound was done September 2019 confirming good intra-uterine location of the IUD.  No menstrual periods and no breakthrough bleeding.  No pelvic pain.  Abstinent x last Pap in 2021 which was Neg. Breasts normal.  Mammo scheduled this week.  Body mass index 26.43.  Lost weight since last year on a low calorie diet and increased physical activity.  Fasting health labs here today.  Will schedule Colono.     Past medical history,surgical history, family history and social history were all reviewed and documented in the EPIC chart.  Gynecologic History No LMP recorded. (Menstrual status: IUD).  Obstetric History OB History  Gravida Para Term Preterm AB Living  _0 SAB IAB Ectopic Multiple Live Births          2    # Outcome Date GA Lbr Len/2nd Weight Sex Delivery Anes PTL Lv  2 Term     M Vag-Spont  N LIV  1 Term     F Vag-Spont  N LIV     ROS: A ROS was performed and pertinent positives and negatives are included in the history.  GENERAL: No fevers or chills. HEENT: No change in vision, no earache, sore throat or sinus congestion. NECK: No pain or stiffness. CARDIOVASCULAR: No chest pain or pressure. No palpitations. PULMONARY: No shortness of breath, cough or wheeze. GASTROINTESTINAL: No abdominal pain, nausea, vomiting or diarrhea, melena or bright red blood per rectum. GENITOURINARY: No urinary frequency, urgency, hesitancy or dysuria. MUSCULOSKELETAL: No joint or muscle pain, no back pain, no recent trauma. DERMATOLOGIC: No rash, no itching, no lesions. ENDOCRINE: No polyuria, polydipsia, no heat or cold intolerance. No recent change in weight. HEMATOLOGICAL: No anemia or easy bruising or bleeding. NEUROLOGIC: No headache, seizures,  numbness, tingling or weakness. PSYCHIATRIC: No depression, no loss of interest in normal activity or change in sleep pattern.     Exam:   BP 112/62   Pulse 72   Resp 16   Ht 5' 6.25" (1.683 m)   Wt 165 lb (74.8 kg)   BMI 26.43 kg/m   Body mass index is 26.43 kg/m.  General appearance : Well developed well nourished female. No acute distress HEENT: Eyes: no retinal hemorrhage or exudates,  Neck supple, trachea midline, no carotid bruits, no thyroidmegaly Lungs: Clear to auscultation, no rhonchi or wheezes, or rib retractions  Heart: Regular rate and rhythm, no murmurs or gallops Breast:Examined in sitting and supine position were symmetrical in appearance, no palpable masses or tenderness,  no skin retraction, no nipple inversion, no nipple discharge, no skin discoloration, no axillary or supraclavicular lymphadenopathy Abdomen: no palpable masses or tenderness, no rebound or guarding Extremities: no edema or skin discoloration or tenderness  Pelvic: Vulva: Normal             Vagina: No gross lesions or discharge  Cervix: No gross lesions or discharge.  IUD strings visible at [facility].  Uterus  AV, normal size, shape and consistency, non-tender and mobile  Adnexa  Without masses or tenderness  Anus: Normal   Assessment/Plan:  50 y.o. female for annual exam   1. Well female exam with routine gynecological exam Well  on Mirena IUD since August 2019.  Strings not visible at the exocervix so a pelvic ultrasound was done September 2019 confirming good intra-uterine location of the IUD.  No menstrual periods and no breakthrough bleeding.  No pelvic pain.  Abstinent x last Pap in 2021 which was Neg. Breasts normal.  Mammo scheduled this week.  Body mass index 26.43.  Lost weight since last year on a low calorie diet and increased physical activity.  Fasting health labs here today.  Will schedule Colono. - TSH - CBC - Lipid Profile - Comp Met (CMET) - Vitamin D 1,25 dihydroxy  2.  Encounter for routine checking of intrauterine contraceptive device (IUD) Well on Mirena IUD x 2019.  IUD in good position, no Cx.  Other orders - Multiple Vitamin (MULTIVITAMIN PO); Take by mouth. - BIOTIN PO; Take by mouth.   Princess Bruins MD, 8:58 AM 08/26/2021

## 2021-08-29 LAB — COMPREHENSIVE METABOLIC PANEL
AG Ratio: 1.7 (calc) (ref 1.0–2.5)
ALT: 16 U/L (ref 6–29)
AST: 22 U/L (ref 10–35)
Albumin: 4.3 g/dL (ref 3.6–5.1)
Alkaline phosphatase (APISO): 80 U/L (ref 37–153)
BUN/Creatinine Ratio: 11 (calc) (ref 6–22)
BUN: 13 mg/dL (ref 7–25)
CO2: 25 mmol/L (ref 20–32)
Calcium: 10.2 mg/dL (ref 8.6–10.4)
Chloride: 110 mmol/L (ref 98–110)
Creat: 1.22 mg/dL — ABNORMAL HIGH (ref 0.50–1.03)
Globulin: 2.5 g/dL (calc) (ref 1.9–3.7)
Glucose, Bld: 98 mg/dL (ref 65–99)
Potassium: 4.7 mmol/L (ref 3.5–5.3)
Sodium: 147 mmol/L — ABNORMAL HIGH (ref 135–146)
Total Bilirubin: 0.5 mg/dL (ref 0.2–1.2)
Total Protein: 6.8 g/dL (ref 6.1–8.1)

## 2021-08-29 LAB — LIPID PANEL
Cholesterol: 187 mg/dL (ref <200)
HDL: 67 mg/dL (ref >=50)
LDL Cholesterol (Calc): 103 mg/dL (calc) — ABNORMAL HIGH
Non-HDL Cholesterol (Calc): 120 mg/dL (calc) (ref <130)
Total CHOL/HDL Ratio: 2.8 (calc) (ref <5.0)
Triglycerides: 81 mg/dL (ref <150)

## 2021-08-29 LAB — CBC
HCT: 36.5 % (ref 35.0–45.0)
Hemoglobin: 11.5 g/dL — ABNORMAL LOW (ref 11.7–15.5)
MCH: 28.8 pg (ref 27.0–33.0)
MCHC: 31.5 g/dL — ABNORMAL LOW (ref 32.0–36.0)
MCV: 91.5 fL (ref 80.0–100.0)
MPV: 11.7 fL (ref 7.5–12.5)
Platelets: 214 10*3/uL (ref 140–400)
RBC: 3.99 10*6/uL (ref 3.80–5.10)
RDW: 12.7 % (ref 11.0–15.0)
WBC: 5.5 10*3/uL (ref 3.8–10.8)

## 2021-08-29 LAB — VITAMIN D 1,25 DIHYDROXY
Vitamin D 1, 25 (OH)2 Total: 47 pg/mL (ref 18–72)
Vitamin D2 1, 25 (OH)2: <8 pg/mL
Vitamin D3 1, 25 (OH)2: 47 pg/mL

## 2021-08-29 LAB — TSH: TSH: 1.99 mIU/L

## 2021-09-09 ENCOUNTER — Ambulatory Visit
Admission: RE | Admit: 2021-09-09 | Discharge: 2021-09-09 | Disposition: A | Discharge status: Home / Self Care | Payer: 59 | Source: Ambulatory Visit | Attending: Obstetrics & Gynecology | Admitting: Obstetrics & Gynecology

## 2021-09-09 ENCOUNTER — Other Ambulatory Visit: Payer: Self-pay

## 2021-09-09 DIAGNOSIS — Z1231 Encounter for screening mammogram for malignant neoplasm of breast: Secondary | ICD-10-CM

## 2021-09-10 ENCOUNTER — Other Ambulatory Visit: Payer: Self-pay | Admitting: Obstetrics & Gynecology

## 2021-09-10 DIAGNOSIS — R928 Other abnormal and inconclusive findings on diagnostic imaging of breast: Secondary | ICD-10-CM

## 2021-09-22 ENCOUNTER — Encounter: Payer: Self-pay | Admitting: Gastroenterology

## 2021-10-15 ENCOUNTER — Ambulatory Visit
Admission: RE | Admit: 2021-10-15 | Discharge: 2021-10-15 | Disposition: A | Discharge status: Home / Self Care | Payer: 59 | Source: Ambulatory Visit | Attending: Obstetrics & Gynecology | Admitting: Obstetrics & Gynecology

## 2021-10-15 ENCOUNTER — Other Ambulatory Visit: Payer: Self-pay

## 2021-10-15 ENCOUNTER — Other Ambulatory Visit: Payer: Self-pay | Admitting: Obstetrics & Gynecology

## 2021-10-15 DIAGNOSIS — R928 Other abnormal and inconclusive findings on diagnostic imaging of breast: Secondary | ICD-10-CM

## 2021-10-15 DIAGNOSIS — N6320 Unspecified lump in the left breast, unspecified quadrant: Secondary | ICD-10-CM

## 2021-11-06 ENCOUNTER — Other Ambulatory Visit: Payer: Self-pay

## 2021-11-06 ENCOUNTER — Ambulatory Visit (AMBULATORY_SURGERY_CENTER): Payer: 59 | Admitting: *Deleted

## 2021-11-06 VITALS — Ht 67.0 in | Wt 154.8 lb

## 2021-11-06 DIAGNOSIS — Z1211 Encounter for screening for malignant neoplasm of colon: Secondary | ICD-10-CM

## 2021-11-06 MED ORDER — SUTAB 1479-225-188 MG PO TABS: 24.0000 | ORAL_TABLET | ORAL | 0 refills | Status: DC

## 2021-11-06 NOTE — Progress Notes (Signed)
No egg or soy allergy known to patient  issues known to pt with past sedation with any surgeries or procedures- PONV  Patient denies ever being told they had issues or difficulty with intubation  No FH of Malignant Hyperthermia Pt is not on diet pills Pt is not on  home 02  Pt is not on blood thinners  Pt denies issues with constipation daily  No A fib or A flutter  Pt is not vaccinated  for Covid  Sutab Coupon to Pharmacy and Pt  NO PA's for preps discussed with pt In PV today  Discussed with pt there will be an out-of-pocket cost for prep and that varies from $0 to 70 +  dollars - pt verbalized understanding   Due to the COVID-19 pandemic we are asking patients to follow certain guidelines in PV and the [facility]   Pt aware of COVID protocols and [facility] guidelines   PV completed over the phone. Pt verified name, DOB, address and insurance during PV today.  Pt mailed instruction packet with copy of consent form to read and not return, and instructions.  Pt encouraged to call with questions or issues.  If pt has My chart, procedure instructions sent via My Chart

## 2021-11-13 ENCOUNTER — Encounter: Payer: Self-pay | Admitting: Gastroenterology

## 2021-11-20 ENCOUNTER — Ambulatory Visit (AMBULATORY_SURGERY_CENTER): Payer: 59 | Admitting: Gastroenterology

## 2021-11-20 ENCOUNTER — Other Ambulatory Visit: Payer: Self-pay

## 2021-11-20 ENCOUNTER — Encounter: Payer: Self-pay | Admitting: Gastroenterology

## 2021-11-20 VITALS — BP 109/58 | HR 68 | Temp 97.8°F | Resp 19 | Ht 66.25 in | Wt 154.8 lb

## 2021-11-20 DIAGNOSIS — Z1211 Encounter for screening for malignant neoplasm of colon: Secondary | ICD-10-CM

## 2021-11-20 MED ORDER — SODIUM CHLORIDE 0.9 % IV SOLN: 500.0000 mL | INTRAVENOUS | Status: DC

## 2021-11-20 NOTE — Op Note (Signed)
[facility] Patient Name: Patty Hale Procedure Date: 11/20/2021 8:03 AM MRN: [id number] Endoscopist: Lorin Picket E. Tomasa Rand , MD Age: 51 Referring MD:  Date of Birth: 10-14-1971 Gender: Female Account #: [id number] Procedure:                Colonoscopy Indications:              Screening for colorectal malignant neoplasm, This                            is the patient's first colonoscopy Medicines:                Monitored Anesthesia Care Procedure:                Pre-Anesthesia Assessment:                           - Prior to the procedure, a History and Physical                            was performed, and patient medications and                            allergies were reviewed. The patient's tolerance of                            previous anesthesia was also reviewed. The risks                            and benefits of the procedure and the sedation                            options and risks were discussed with the patient.                            All questions were answered, and informed consent                            was obtained. Prior Anticoagulants: The patient has                            taken no previous anticoagulant or antiplatelet                            agents. ASA Grade Assessment: II - A patient with                            mild systemic disease. After reviewing the risks                            and benefits, the patient was deemed in                            satisfactory condition to undergo the procedure.  After obtaining informed consent, the colonoscope                            was passed under direct vision. Throughout the                            procedure, the patient's blood pressure, pulse, and                            oxygen saturations were monitored continuously. The                            Olympus CF-HQ190L [id number] [id number]) Colonoscope was                            introduced through the anus  and advanced to the the                            terminal ileum, with identification of the                            appendiceal orifice and IC valve. The colonoscopy                            was performed without difficulty. The patient                            tolerated the procedure well. The quality of the                            bowel preparation was excellent. The terminal                            ileum, ileocecal valve, appendiceal orifice, and                            rectum were photographed. Scope In: 8:09:39 AM Scope Out: 8:25:19 AM Scope Withdrawal Time: 0 hours 9 minutes 55 seconds  Total Procedure Duration: 0 hours 15 minutes 40 seconds  Findings:                 The perianal and digital rectal examinations were                            normal. Pertinent negatives include normal                            sphincter tone and no palpable rectal lesions.                           The colon (entire examined portion) appeared normal.                           The terminal ileum appeared normal.  The retroflexed view of the distal rectum and anal                            verge was normal and showed no anal or rectal                            abnormalities. Complications:            No immediate complications. Estimated Blood Loss:     Estimated blood loss: none. Impression:               - The entire examined colon is normal.                           - The examined portion of the ileum was normal.                           - The distal rectum and anal verge are normal on                            retroflexion view.                           - No specimens collected. Recommendation:           - Patient has a contact number available for                            emergencies. The signs and symptoms of potential                            delayed complications were discussed with the                            patient. Return to  normal activities tomorrow.                            Written discharge instructions were provided to the                            patient.                           - Resume previous diet.                           - Continue present medications.                           - Repeat colonoscopy in 10 years for screening                            purposes. Elina Streng E. Tomasa Rand, MD 11/20/2021 8:32:53 AM This report has been signed electronically.

## 2021-11-20 NOTE — Progress Notes (Signed)
Pt's states no medical or surgical changes since previsit or office visit. 

## 2021-11-20 NOTE — Progress Notes (Signed)
[facility] History and Physical   Primary Care Physician:  Renford Dills, MD   Reason for Procedure:   Colon cancer screening  Plan:    Screening colonoscopy     HPI: Patty Hale is a 51 y.o. female undergoing initial average risk screening colonoscopy.  She has no family history of colon cancer and no chronic GI symptoms.    Past Medical History:  Diagnosis Date   Arthritis    knees   Dental crowns present    GERD (gastroesophageal reflux disease)    no meds- if no caffeine or chocolate no issues   Lesion of breast 01/2015   left- negative   Vitamin D deficiency     Past Surgical History:  Procedure Laterality Date   BREAST EXCISIONAL BIOPSY Left 2016   BREAST LUMPECTOMY WITH RADIOACTIVE SEED LOCALIZATION Left 01/31/2015   Procedure: BREAST LUMPECTOMY WITH RADIOACTIVE SEED LOCALIZATION;  Surgeon: Chevis Pretty III, MD;  Location: [facility];  Service: General;  Laterality: Left;   INTRAUTERINE DEVICE (IUD) INSERTION     mirena inserted 07-11-17   INTRAUTERINE DEVICE INSERTION  07/17/2008   MIRENA   KNEE ARTHROSCOPY Right 1991   KNEE SURGERY  2019   KNEE SURGERY Left 2021   SHOULDER ARTHROSCOPY WITH ROTATOR CUFF REPAIR AND SUBACROMIAL DECOMPRESSION Right 02/16/2014   Procedure: RIGHT SHOULDER ARTHROSCOPY WITH DEBRIDEMENT EXTENSIVE/DISTAL CLAVICULECTOMY; SUBACROMIAL DECOMPRESSION;  PARTIAL ACROMIOPLASTY AND OPEN ROTATOR CUFF REPAIR;  Surgeon: Thera Flake., MD;  Location:  SURGERY CENTER;  Service: Orthopedics;  Laterality: Right;  ANESTHESIA: GENERAL, PRE/POST OP SCALENE    Prior to Admission medications   Medication Sig Start Date End Date Taking? Authorizing Provider  ALPRAZolam (XANAX) 0.25 MG tablet Take 0.25 mg by mouth daily as needed. 04/26/20  Yes [provider]  Ascorbic Acid (VITAMIN C) 500 MG CHEW    Yes [provider]  BIOTIN PO Take by mouth.   Yes [provider]  Calcium-Vitamin D-Vitamin  K ([employer] CALCIUM SOFT CHEWS) 650-12.5-40 MG-MCG-MCG CHEW  10/20/21  Yes [provider]  cholecalciferol (VITAMIN D3) 25 MCG (1000 UT) tablet Take 1,000 Units by mouth daily.   Yes [provider]  levonorgestrel (MIRENA) 20 MCG/24HR IUD 1 each by Intrauterine route once.   Yes [provider]  Multiple Vitamin (MULTIVITAMIN PO) Take by mouth.   Yes [provider]  Potassium (POTASSIMIN PO) Take 1 tablet by mouth daily.   Yes [provider]  vitamin E 400 UNIT capsule Take 400 Units by mouth daily.   Yes [provider]    Current Outpatient Medications  Medication Sig Dispense Refill   ALPRAZolam (XANAX) 0.25 MG tablet Take 0.25 mg by mouth daily as needed.     Ascorbic Acid (VITAMIN C) 500 MG CHEW      BIOTIN PO Take by mouth.     Calcium-Vitamin D-Vitamin K ([employer] CALCIUM SOFT CHEWS) 650-12.5-40 MG-MCG-MCG CHEW      cholecalciferol (VITAMIN D3) 25 MCG (1000 UT) tablet Take 1,000 Units by mouth daily.     levonorgestrel (MIRENA) 20 MCG/24HR IUD 1 each by Intrauterine route once.     Multiple Vitamin (MULTIVITAMIN PO) Take by mouth.     Potassium (POTASSIMIN PO) Take 1 tablet by mouth daily.     vitamin E 400 UNIT capsule Take 400 Units by mouth daily.     Current Facility-Administered Medications  Medication Dose Route Frequency Provider Last Rate Last Admin   0.9 %  sodium chloride infusion  500 mL Intravenous Continuous Jenel Lucksunningham, Johnta Couts E, MD        Allergies as of 11/20/2021   (No Known Allergies)    Family History  Problem Relation Age of Onset   Hyperlipidemia Mother    Hypertension Mother    Hyperlipidemia Father    Hypertension Maternal Aunt    Heart disease Maternal Grandfather    Colon cancer Neg Hx    Colon polyps Neg Hx    Esophageal cancer Neg Hx    Rectal cancer Neg Hx    Stomach cancer Neg Hx     Social History   Socioeconomic History   Marital status: Legally Separated    Spouse name: Not on file    Number of children: Not on file   Years of education: Not on file   Highest education level: Not on file  Occupational History   Not on file  Tobacco Use   Smoking status: Never   Smokeless tobacco: Never  Vaping Use   Vaping Use: Never used  Substance and Sexual Activity   Alcohol use: Not Currently   Drug use: No   Sexual activity: Not Currently    Partners: Male    Birth control/protection: I.U.D.    Comment: 1st intercourse 51 yo-Fewer than 5 partners  Other Topics Concern   Not on file  Social History Narrative   Not on file   Social Determinants of Health   Financial Resource Strain: Not on file  Food Insecurity: Not on file  Transportation Needs: Not on file  Physical Activity: Not on file  Stress: Not on file  Social Connections: Not on file  Intimate Partner Violence: Not on file    Review of Systems:  All other review of systems negative except as mentioned in the HPI.  Physical Exam: Vital signs BP 117/69    Pulse 86    Temp 97.8 F (36.6 C) (Temporal)    Ht 5' 6.25" (1.683 m)    Wt 154 lb 12.8 oz (70.2 kg)    LMP  (LMP Unknown)    SpO2 100%    BMI 24.80 kg/m   General:   Alert,  Well-developed, well-nourished, pleasant and cooperative in NAD Airway:  Mallampati 3 Lungs:  Clear throughout to auscultation.   Heart:  Regular rate and rhythm; no murmurs, clicks, rubs,  or gallops. Abdomen:  Soft, nontender and nondistended. Normal bowel sounds.   Neuro/Psych:  Normal mood and affect. A and O x 3   Latish Toutant E. Tomasa Randunningham, MD [facility]

## 2021-11-20 NOTE — Progress Notes (Signed)
Pt non-responsive, VVS, Report to RN  °

## 2021-11-20 NOTE — Patient Instructions (Signed)
Resume previous diet and medications. Repeat Colonoscopy in 10 years for screening purposes. ° °YOU HAD AN ENDOSCOPIC PROCEDURE TODAY AT THE [facility]:   Refer to the procedure report that was given to you for any specific questions about what was found during the examination.  If the procedure report does not answer your questions, please call your gastroenterologist to clarify.  If you requested that your care partner not be given the details of your procedure findings, then the procedure report has been included in a sealed envelope for you to review at your convenience later. ° °YOU SHOULD EXPECT: Some feelings of bloating in the abdomen. Passage of more gas than usual.  Walking can help get rid of the air that was put into your GI tract during the procedure and reduce the bloating. If you had a lower endoscopy (such as a colonoscopy or flexible sigmoidoscopy) you may notice spotting of blood in your stool or on the toilet paper. If you underwent a bowel prep for your procedure, you may not have a normal bowel movement for a few days. ° °Please Note:  You might notice some irritation and congestion in your nose or some drainage.  This is from the oxygen used during your procedure.  There is no need for concern and it should clear up in a day or so. ° °SYMPTOMS TO REPORT IMMEDIATELY: ° °Following lower endoscopy (colonoscopy or flexible sigmoidoscopy): ° Excessive amounts of blood in the stool ° Significant tenderness or worsening of abdominal pains ° Swelling of the abdomen that is new, acute ° Fever of 100°F or higher ° °For urgent or emergent issues, a gastroenterologist can be reached at any hour by calling [phone]. °Do not use MyChart messaging for urgent concerns.  ° ° °DIET:  We do recommend a small meal at first, but then you may proceed to your regular diet.  Drink plenty of fluids but you should avoid alcoholic beverages for 24 hours. ° °ACTIVITY:  You should plan to take it easy  for the rest of today and you should NOT DRIVE or use heavy machinery until tomorrow (because of the sedation medicines used during the test).   ° °FOLLOW UP: °Our staff will call the number listed on your records 48-72 hours following your procedure to check on you and address any questions or concerns that you may have regarding the information given to you following your procedure. If we do not reach you, we will leave a message.  We will attempt to reach you two times.  During this call, we will ask if you have developed any symptoms of COVID 19. If you develop any symptoms (ie: fever, flu-like symptoms, shortness of breath, cough etc.) before then, please call [phone].  If you test positive for Covid 19 in the 2 weeks post procedure, please call and report this information to us.   ° °If any biopsies were taken you will be contacted by phone or by letter within the next 1-3 weeks.  Please call us at [phone] if you have not heard about the biopsies in 3 weeks.  ° ° °SIGNATURES/CONFIDENTIALITY: °You and/or your care partner have signed paperwork which will be entered into your electronic medical record.  These signatures attest to the fact that that the information above on your After Visit Summary has been reviewed and is understood.  Full responsibility of the confidentiality of this discharge information lies with you and/or your care-partner.  °

## 2021-11-24 ENCOUNTER — Telehealth: Payer: Self-pay

## 2021-11-24 ENCOUNTER — Telehealth: Payer: Self-pay | Admitting: *Deleted

## 2021-11-24 NOTE — Telephone Encounter (Signed)
°  Follow up Call-  Call back number 11/20/2021  Post procedure Call Back phone  # [phone]  Permission to leave phone message Yes  Some recent data might be hidden     Patient questions: Message left to call if necessary.

## 2021-11-24 NOTE — Telephone Encounter (Signed)
°  Follow up Call-  Call back number 11/20/2021  Post procedure Call Back phone  # [phone]  Permission to leave phone message Yes  Some recent data might be hidden     Patient questions:  Do you have a fever, pain , or abdominal swelling? No. Pain Score  0 *  Have you tolerated food without any problems? Yes.    Have you been able to return to your normal activities? Yes.    Do you have any questions about your discharge instructions: Diet   No. Medications  No. Follow up visit  No.  Do you have questions or concerns about your Care? No.  Actions: * If pain score is 4 or above: No action needed, pain <4.  Pt reported she has not had a "normal BM yet".  She was asking if this is normal.  I explained it may take several days, and this was common since the prep clean out her colon.  If she does not get back to regular BM or having in abdominal pain to give [place] a call back .  Pt said she would.    Have you developed a fever since your procedure? no  2.   Have you had an respiratory symptoms (SOB or cough) since your procedure? no  3.   Have you tested positive for COVID 19 since your procedure no  4.   Have you had any family members/close contacts diagnosed with the COVID 19 since your procedure?  no   If yes to any of these questions please route to Joylene John, RN and Joella Prince, RN

## 2022-04-16 ENCOUNTER — Other Ambulatory Visit: Payer: Self-pay | Admitting: Obstetrics & Gynecology

## 2022-04-16 ENCOUNTER — Ambulatory Visit
Admission: RE | Admit: 2022-04-16 | Discharge: 2022-04-16 | Disposition: A | Discharge status: Home / Self Care | Payer: 59 | Source: Ambulatory Visit | Attending: Obstetrics & Gynecology | Admitting: Obstetrics & Gynecology

## 2022-04-16 DIAGNOSIS — N6320 Unspecified lump in the left breast, unspecified quadrant: Secondary | ICD-10-CM

## 2022-04-30 ENCOUNTER — Other Ambulatory Visit: Payer: Self-pay | Admitting: Orthopaedic Surgery

## 2022-04-30 DIAGNOSIS — S46002A Unspecified injury of muscle(s) and tendon(s) of the rotator cuff of left shoulder, initial encounter: Secondary | ICD-10-CM

## 2022-05-10 ENCOUNTER — Ambulatory Visit
Admission: RE | Admit: 2022-05-10 | Discharge: 2022-05-10 | Disposition: A | Discharge status: Home / Self Care | Payer: 59 | Source: Ambulatory Visit | Attending: Orthopaedic Surgery | Admitting: Orthopaedic Surgery

## 2022-05-10 DIAGNOSIS — S46002A Unspecified injury of muscle(s) and tendon(s) of the rotator cuff of left shoulder, initial encounter: Secondary | ICD-10-CM

## 2022-06-08 HISTORY — PX: SHOULDER SURGERY: SHX246

## 2022-08-27 ENCOUNTER — Encounter: Payer: Self-pay | Admitting: Obstetrics & Gynecology

## 2022-08-27 ENCOUNTER — Other Ambulatory Visit (HOSPITAL_COMMUNITY)
Admission: RE | Admit: 2022-08-27 | Discharge: 2022-08-27 | Disposition: A | Discharge status: Home / Self Care | Payer: 59 | Source: Ambulatory Visit | Attending: Obstetrics & Gynecology | Admitting: Obstetrics & Gynecology

## 2022-08-27 ENCOUNTER — Ambulatory Visit (INDEPENDENT_AMBULATORY_CARE_PROVIDER_SITE_OTHER): Payer: 59 | Admitting: Obstetrics & Gynecology

## 2022-08-27 VITALS — BP 112/80 | HR 78 | Ht 66.25 in | Wt 160.0 lb

## 2022-08-27 DIAGNOSIS — Z01419 Encounter for gynecological examination (general) (routine) without abnormal findings: Secondary | ICD-10-CM | POA: Insufficient documentation

## 2022-08-27 DIAGNOSIS — Z30431 Encounter for routine checking of intrauterine contraceptive device: Secondary | ICD-10-CM

## 2022-08-27 LAB — COMPREHENSIVE METABOLIC PANEL
AG Ratio: 1.8 (calc) (ref 1.0–2.5)
ALT: 19 U/L (ref 6–29)
AST: 23 U/L (ref 10–35)
Albumin: 4.4 g/dL (ref 3.6–5.1)
Alkaline phosphatase (APISO): 69 U/L (ref 37–153)
BUN/Creatinine Ratio: 14 (calc) (ref 6–22)
BUN: 16 mg/dL (ref 7–25)
CO2: 28 mmol/L (ref 20–32)
Calcium: 9.9 mg/dL (ref 8.6–10.4)
Chloride: 106 mmol/L (ref 98–110)
Creat: 1.18 mg/dL — ABNORMAL HIGH (ref 0.50–1.03)
Globulin: 2.4 g/dL (calc) (ref 1.9–3.7)
Glucose, Bld: 85 mg/dL (ref 65–99)
Potassium: 4.1 mmol/L (ref 3.5–5.3)
Sodium: 142 mmol/L (ref 135–146)
Total Bilirubin: 0.6 mg/dL (ref 0.2–1.2)
Total Protein: 6.8 g/dL (ref 6.1–8.1)

## 2022-08-27 LAB — LIPID PANEL
Cholesterol: 181 mg/dL (ref <200)
HDL: 77 mg/dL (ref >=50)
LDL Cholesterol (Calc): 90 mg/dL (calc)
Non-HDL Cholesterol (Calc): 104 mg/dL (calc) (ref <130)
Total CHOL/HDL Ratio: 2.4 (calc) (ref <5.0)
Triglycerides: 57 mg/dL (ref <150)

## 2022-08-27 LAB — VITAMIN D 25 HYDROXY (VIT D DEFICIENCY, FRACTURES): Vit D, 25-Hydroxy: 36 ng/mL (ref 30–100)

## 2022-08-27 LAB — CBC
HCT: 40.2 % (ref 35.0–45.0)
Hemoglobin: 13.1 g/dL (ref 11.7–15.5)
MCH: 29.1 pg (ref 27.0–33.0)
MCHC: 32.6 g/dL (ref 32.0–36.0)
MCV: 89.3 fL (ref 80.0–100.0)
MPV: 11.1 fL (ref 7.5–12.5)
Platelets: 202 10*3/uL (ref 140–400)
RBC: 4.5 10*6/uL (ref 3.80–5.10)
RDW: 12.7 % (ref 11.0–15.0)
WBC: 4.5 10*3/uL (ref 3.8–10.8)

## 2022-08-27 LAB — TSH: TSH: 1.97 mIU/L

## 2022-08-27 NOTE — Progress Notes (Signed)
Patty Hale 1971-01-18 [id number]   History:    51 y.o. G2P2L2 Divorced   RP:  Established patient presenting for annual gyn exam    HPI: Well on Mirena IUD since August 2019.  Strings not visible at the exocervix so a pelvic ultrasound was done September 2019 confirming good intra-uterine location of the IUD.  No menstrual periods and no breakthrough bleeding.  No pelvic pain.  Abstinent x last Pap in 2021 which was Neg. Pap reflex today.  Breasts normal.  Screening mammo 08/2021.  Left Dx mammo/US Probably benign in 03/2022.  Mammo scheduled in 10/2022.  Body mass index 25.63.  Lost weight since last year on a low calorie diet and increased physical activity.  Fasting health labs here today. Colono 11/2021.  Recommend evaluation with Fam MD Dr Delfina Redwood for light headedness, no fainting.  Declines Flu vaccine.   Past medical history,surgical history, family history and social history were all reviewed and documented in the EPIC chart.  Gynecologic History No LMP recorded. (Menstrual status: IUD).  Obstetric History OB History  Gravida Para Term Preterm AB Living  _0 SAB IAB Ectopic Multiple Live Births          2    # Outcome Date GA Lbr Len/2nd Weight Sex Delivery Anes PTL Lv  2 Term     M Vag-Spont  N LIV  1 Term     F Vag-Spont  N LIV     ROS: A ROS was performed and pertinent positives and negatives are included in the history. GENERAL: No fevers or chills. HEENT: No change in vision, no earache, sore throat or sinus congestion. NECK: No pain or stiffness. CARDIOVASCULAR: No chest pain or pressure. No palpitations. PULMONARY: No shortness of breath, cough or wheeze. GASTROINTESTINAL: No abdominal pain, nausea, vomiting or diarrhea, melena or bright red blood per rectum. GENITOURINARY: No urinary frequency, urgency, hesitancy or dysuria. MUSCULOSKELETAL: No joint or muscle pain, no back pain, no recent trauma. DERMATOLOGIC: No rash, no itching, no lesions. ENDOCRINE: No  polyuria, polydipsia, no heat or cold intolerance. No recent change in weight. HEMATOLOGICAL: No anemia or easy bruising or bleeding. NEUROLOGIC: No headache, seizures, numbness, tingling or weakness. PSYCHIATRIC: No depression, no loss of interest in normal activity or change in sleep pattern.     Exam:   BP 112/80   Pulse 78   Ht 5' 6.25" (1.683 m)   Wt 160 lb (72.6 kg)   SpO2 99%   BMI 25.63 kg/m   Body mass index is 25.63 kg/m.  General appearance : Well developed well nourished female. No acute distress HEENT: Eyes: no retinal hemorrhage or exudates,  Neck supple, trachea midline, no carotid bruits, no thyroidmegaly Lungs: Clear to auscultation, no rhonchi or wheezes, or rib retractions  Heart: Regular rate and rhythm, no murmurs or gallops Breast:Examined in sitting and supine position were symmetrical in appearance, no palpable masses or tenderness,  no skin retraction, no nipple inversion, no nipple discharge, no skin discoloration, no axillary or supraclavicular lymphadenopathy Abdomen: no palpable masses or tenderness, no rebound or guarding Extremities: no edema or skin discoloration or tenderness  Pelvic: Vulva: Normal             Vagina: No gross lesions or discharge  Cervix: No gross lesions or discharge. IUD strings not visible.  Pap reflex done.  Uterus  AV, normal size, shape and consistency, non-tender and mobile  Adnexa  Without  masses or tenderness  Anus: Normal   Assessment/Plan:  51 y.o. female for annual exam   1. Encounter for routine gynecological examination with Papanicolaou smear of cervix Well on Mirena IUD since August 2019.  Strings not visible at the exocervix so a pelvic ultrasound was done September 2019 confirming good intra-uterine location of the IUD.  No menstrual periods and no breakthrough bleeding.  No pelvic pain.  Abstinent x last Pap in 2021 which was Neg. Pap reflex today.  Breasts normal.  Screening mammo 08/2021.  Left Dx mammo/US  Probably benign in 03/2022.  Mammo scheduled in 10/2022.  Body mass index 25.63.  Lost weight since last year on a low calorie diet and increased physical activity.  Fasting health labs here today. Colono 11/2021.  Recommend evaluation with Fam MD Dr Delfina Redwood for light headedness, no fainting.  Declines Flu vaccine. - CBC - Comp Met (CMET) - TSH - Lipid Profile - Vitamin D (25 hydroxy) - Cytology - PAP( [place])  2. Encounter for routine checking of intrauterine contraceptive device (IUD) Well on Mirena IUD since August 2019.  Strings not visible at the exocervix so a pelvic ultrasound was done September 2019 confirming good intra-uterine location of the IUD.  No menstrual periods and no breakthrough bleeding.  No pelvic pain.  Precautions reviewed, will repeat a Pelvic US if develops abdominopelvic pains.  Other orders - UNABLE TO FIND; Med Name: emergen-C   Princess Bruins MD, 8:38 AM 08/27/2022

## 2022-08-28 LAB — CYTOLOGY - PAP: Diagnosis: NEGATIVE

## 2022-09-06 ENCOUNTER — Emergency Department (HOSPITAL_COMMUNITY)
Admission: EM | Admit: 2022-09-06 | Discharge: 2022-09-06 | Disposition: A | Discharge status: Home / Self Care | Payer: 59 | Attending: Emergency Medicine | Admitting: Emergency Medicine

## 2022-09-06 ENCOUNTER — Encounter (HOSPITAL_COMMUNITY): Payer: Self-pay

## 2022-09-06 ENCOUNTER — Emergency Department (HOSPITAL_COMMUNITY): Payer: 59

## 2022-09-06 ENCOUNTER — Other Ambulatory Visit: Payer: Self-pay

## 2022-09-06 ENCOUNTER — Ambulatory Visit
Admission: EM | Admit: 2022-09-06 | Discharge: 2022-09-06 | Disposition: A | Discharge status: Home / Self Care | Payer: 59 | Attending: Emergency Medicine | Admitting: Emergency Medicine

## 2022-09-06 DIAGNOSIS — R9431 Abnormal electrocardiogram [ECG] [EKG]: Secondary | ICD-10-CM | POA: Diagnosis not present

## 2022-09-06 DIAGNOSIS — R072 Precordial pain: Secondary | ICD-10-CM | POA: Insufficient documentation

## 2022-09-06 DIAGNOSIS — R0789 Other chest pain: Secondary | ICD-10-CM

## 2022-09-06 DIAGNOSIS — I252 Old myocardial infarction: Secondary | ICD-10-CM | POA: Diagnosis not present

## 2022-09-06 DIAGNOSIS — R079 Chest pain, unspecified: Secondary | ICD-10-CM

## 2022-09-06 LAB — BASIC METABOLIC PANEL
Anion gap: 8 (ref 5–15)
BUN: 21 mg/dL — ABNORMAL HIGH (ref 6–20)
CO2: 26 mmol/L (ref 22–32)
Calcium: 9.7 mg/dL (ref 8.9–10.3)
Chloride: 108 mmol/L (ref 98–111)
Creatinine, Ser: 1.17 mg/dL — ABNORMAL HIGH (ref 0.44–1.00)
GFR, Estimated: 56 mL/min — ABNORMAL LOW (ref >60)
Glucose, Bld: 92 mg/dL (ref 70–99)
Potassium: 4.5 mmol/L (ref 3.5–5.1)
Sodium: 142 mmol/L (ref 135–145)

## 2022-09-06 LAB — CBC
HCT: 42.7 % (ref 36.0–46.0)
Hemoglobin: 13.6 g/dL (ref 12.0–15.0)
MCH: 29 pg (ref 26.0–34.0)
MCHC: 31.9 g/dL (ref 30.0–36.0)
MCV: 91 fL (ref 80.0–100.0)
Platelets: 204 10*3/uL (ref 150–400)
RBC: 4.69 MIL/uL (ref 3.87–5.11)
RDW: 13.2 % (ref 11.5–15.5)
WBC: 5.5 10*3/uL (ref 4.0–10.5)
nRBC: 0 % (ref 0.0–0.2)

## 2022-09-06 LAB — TROPONIN I (HIGH SENSITIVITY)
Troponin I (High Sensitivity): 2 ng/L (ref <18)
Troponin I (High Sensitivity): <2 ng/L (ref <18)

## 2022-09-06 MED ORDER — PANTOPRAZOLE SODIUM 20 MG PO TBEC: 20.0000 mg | DELAYED_RELEASE_TABLET | Freq: Every day | ORAL | 0 refills | Status: AC

## 2022-09-06 MED ORDER — SUCRALFATE 1 G PO TABS: 1.0000 g | ORAL_TABLET | Freq: Three times a day (TID) | ORAL | 0 refills | Status: DC

## 2022-09-06 NOTE — ED Triage Notes (Signed)
Reports lightheaded and dizziness for a couple weeks. Recent DX or prediabetes. Denies taking OTC medication.  Mid chest pain started today. Denies pain radiating to extremities. Denies SOB.

## 2022-09-06 NOTE — ED Provider Notes (Signed)
Patty Hale   CSN: [id number] Arrival date & time: 09/06/22  1206     History  Chief Complaint  Patient presents with   Chest Pain    Patty Hale is a 51 y.o. female.  HPI Patient presents from urgent care due to chest pain.  She is accompanied by her mother.  She notes that for weeks, possibly months that she has had episodic lightheadedness, dizziness.  This typically occurs when she stands up suddenly.  No chest pain until today.  About 3 hours ago she began having sternal pain, 1 or 2 out of 10.  No exertional dyspnea, no pleuritic features, no fever.  She notes that she has been generally well.  She does not smoke, does not drink, did drive from [place] a few days ago, but her lightheadedness began well before that time.  She has no family history of anything notable according to her mother.    Home Medications Prior to Admission medications   Medication Sig Start Date End Date Taking? Authorizing Provider  pantoprazole (PROTONIX) 20 MG tablet Take 1 tablet (20 mg total) by mouth daily. 09/06/22  Yes Carmin Muskrat, MD  sucralfate (CARAFATE) 1 g tablet Take 1 tablet (1 g total) by mouth 4 (four) times daily -  with meals and at bedtime. 09/06/22  Yes Carmin Muskrat, MD  ALPRAZolam Duanne Moron) 0.25 MG tablet Take 0.25 mg by mouth daily as needed. 04/26/20   [provider]  BIOTIN PO Take by mouth.    [provider]  Calcium-Vitamin D-Vitamin K ([employer] CALCIUM SOFT CHEWS) 650-12.5-40 MG-MCG-MCG CHEW  10/20/21   [provider]  cholecalciferol (VITAMIN D3) 25 MCG (1000 UT) tablet Take 1,000 Units by mouth daily.    [provider]  levonorgestrel (MIRENA) 20 MCG/24HR IUD 1 each by Intrauterine route once.    [provider]  Multiple Vitamin (MULTIVITAMIN PO) Take by mouth.    [provider]  Potassium (POTASSIMIN PO) Take 1 tablet by mouth as needed.    [provider]   UNABLE TO FIND Med Name: emergen-C    [provider]  vitamin E 400 UNIT capsule Take 400 Units by mouth daily.    [provider]      Allergies    Patient has no known allergies.    Review of Systems   Review of Systems  All other systems reviewed and are negative.   Physical Exam Updated Vital Signs BP 123/70   Pulse 95   Temp 98.5 F (36.9 C)   Resp 18   SpO2 100%  Physical Exam Vitals and nursing Hale reviewed.  Constitutional:      General: She is not in acute distress.    Appearance: She is well-developed.  HENT:     Head: Normocephalic and atraumatic.  Eyes:     Conjunctiva/sclera: Conjunctivae normal.  Cardiovascular:     Rate and Rhythm: Normal rate and regular rhythm.  Pulmonary:     Effort: Pulmonary effort is normal. No respiratory distress.     Breath sounds: Normal breath sounds. No stridor.  Abdominal:     General: There is no distension.  Skin:    General: Skin is warm and dry.  Neurological:     Mental Status: She is alert and oriented to person, place, and time.     Cranial Nerves: No cranial nerve deficit.  Psychiatric:        Mood and Affect: Mood normal.  ED Results / Procedures / Treatments   Labs (all labs ordered are listed, but only abnormal results are displayed) Labs Reviewed  BASIC METABOLIC PANEL - Abnormal; Notable for the following components:      Result Value   BUN 21 (*)    Creatinine, Ser 1.17 (*)    GFR, Estimated 56 (*)    All other components within normal limits  CBC  TROPONIN I (HIGH SENSITIVITY)  TROPONIN I (HIGH SENSITIVITY)    EKG EKG Interpretation  Date/Time:  Sunday September 06 2022 12:17:04 EST Ventricular Rate:  81 PR Interval:  130 QRS Duration: 95 QT Interval:  354 QTC Calculation: 411 R Axis:   86 Text Interpretation: Sinus rhythm Confirmed by Cathren Laine ([id number]) on 09/06/2022 12:32:15 PM  Radiology DG Chest 2 View  Result Date: 09/06/2022 CLINICAL DATA:  Acute  onset of chest pain this morning. EXAM: CHEST - 2 VIEW COMPARISON:  None Available. FINDINGS: The heart size and mediastinal contours are within normal limits. Both lungs are clear. The visualized skeletal structures are unremarkable. IMPRESSION: No active cardiopulmonary disease. Electronically Signed   By: Danae Orleans M.D.   On: 09/06/2022 13:09    Procedures Procedures    Medications Ordered in ED Medications - No data to display  ED Course/ Medical Decision Making/ A&P This patient with a Hx of heartburn, with supine discomfort, reflux symptoms, otherwise generally well, non-smoker, nondrinker presents to the ED for concern of chest pain, 1 or 2 out of 10, this involves an extensive number of treatment options, and is a complaint that carries with it a high risk of complications and morbidity.    The differential diagnosis includes ACS, esophageal irritation, musculoskeletal, less likely PE given the absence of risk factors.   Social Determinants of Health:  No limiting factors  Additional history obtained:  Additional history and/or information obtained from mother at bedside for HPI, and urgent care Hale reviewed, including differential with unstable angina.  I also reviewed the ECG from urgent care, notable for QRS abnormalities anteriorly, no acute ischemic changes   After the initial evaluation, orders, including: Labs monitoring were initiated.   Patient placed on Cardiac and Pulse-Oximetry Monitors. The patient was maintained on a cardiac monitor.  The cardiac monitored showed an rhythm of 90 sinus normal The patient was also maintained on pulse oximetry. The readings were typically 100% room air normal   On repeat evaluation of the patient stayed the same  Lab Tests:  I personally interpreted labs.  The pertinent results include: Troponin normal x2.  Creatinine 1.17, stable from most recent  Imaging Studies ordered:  I independently visualized and interpreted  imaging which showed unremarkable chest x-ray I agree with the radiologist interpretation   Dispostion / Final MDM:  After consideration of the diagnostic results and the patient's response to treatment, the patient is awake, alert, in no distress on repeat exam.  She, her mother and I discussed all findings.  Heart score 2, Wells negative, no evidence for pneumonia, bacteremia, sepsis.  Given the patient description of heartburn, some suspicion for this contributing to her symptoms.  Hospitalization a consideration, but the patient is appropriate for close outpatient follow-up.  She has a primary care physician.   Final Clinical Impression(s) / ED Diagnoses Final diagnoses:  Atypical chest pain    Rx / DC Orders ED Discharge Orders          Ordered    sucralfate (CARAFATE) 1 g tablet  3 times daily  with meals & bedtime       Hale to Pharmacy: Take for one week   09/06/22 1626    pantoprazole (PROTONIX) 20 MG tablet  Daily        09/06/22 1626              Carmin Muskrat, MD 09/06/22 1627

## 2022-09-06 NOTE — ED Provider Notes (Addendum)
[facility]    CSN: [id number] Arrival date & time: 09/06/22  1123      History   Chief Complaint Chief Complaint  Patient presents with   Dizziness   Chest Pain    HPI Patty Hale is a 51 y.o. female.   Patient complains of dizziness that is been going on for about a week.  Patient states she began to have nonfocal chest pain this morning so came to urgent care for further evaluation.  Patient denies shortness of breath, endorses fatigue.  Patient has normal vital signs on arrival today but appears to be in mild distress due to discomfort.  Patient is not diaphoretic.  Patient is accompanied by her brother today.  Patient reports a history of prediabetes.  Patient reports a family history of hyperlipidemia, hypertension.  The history is provided by the patient.    Past Medical History:  Diagnosis Date   Arthritis    knees   Dental crowns present    GERD (gastroesophageal reflux disease)    no meds- if no caffeine or chocolate no issues   Lesion of breast 01/2015   left- negative   Vitamin D deficiency     Patient Active Problem List   Diagnosis Date Noted   Anxiety 10/03/2020   Constipation 10/03/2020   Gastroesophageal reflux disease without esophagitis 10/03/2020   Panic attack 10/03/2020   Paresthesia 10/03/2020   Unspecified abnormal finding in specimens from other organs, systems and tissues 10/03/2020   Urinary tract infectious disease 10/03/2020   Vitamin D deficiency 11/16/2016   Radial scar of breast 03/21/2015   Dense breast tissue 03/21/2015   IUD (intrauterine device) in place 10/11/2013   Mastodynia 01/25/2013   Hx gestational diabetes 10/05/2011    Past Surgical History:  Procedure Laterality Date   BREAST EXCISIONAL BIOPSY Left 2016   BREAST LUMPECTOMY WITH RADIOACTIVE SEED LOCALIZATION Left 01/31/2015   Procedure: BREAST LUMPECTOMY WITH RADIOACTIVE SEED LOCALIZATION;  Surgeon: Chevis Pretty III, MD;  Location: [facility];  Service: General;  Laterality: Left;   INTRAUTERINE DEVICE (IUD) INSERTION     mirena inserted 07-11-17   INTRAUTERINE DEVICE INSERTION  07/17/2008   MIRENA   KNEE ARTHROSCOPY Right 1991   KNEE SURGERY  2019   KNEE SURGERY Left 2021   SHOULDER ARTHROSCOPY WITH ROTATOR CUFF REPAIR AND SUBACROMIAL DECOMPRESSION Right 02/16/2014   Procedure: RIGHT SHOULDER ARTHROSCOPY WITH DEBRIDEMENT EXTENSIVE/DISTAL CLAVICULECTOMY; SUBACROMIAL DECOMPRESSION;  PARTIAL ACROMIOPLASTY AND OPEN ROTATOR CUFF REPAIR;  Surgeon: Thera Flake., MD;  Location: [facility];  Service: Orthopedics;  Laterality: Right;  ANESTHESIA: GENERAL, PRE/POST OP SCALENE   SHOULDER SURGERY Left 06/08/2022    OB History     Gravida  2   Para  2   Term  2   Preterm      AB      Living  2      SAB      IAB      Ectopic      Multiple      Live Births  2            Home Medications    Prior to Admission medications   Medication Sig Start Date End Date Taking? Authorizing Provider  ALPRAZolam (XANAX) 0.25 MG tablet Take 0.25 mg by mouth daily as needed. 04/26/20   [provider]  BIOTIN PO Take by mouth.    [provider]  Calcium-Vitamin D-Vitamin K ([facility]  CALCIUM SOFT CHEWS) 650-12.5-40 MG-MCG-MCG CHEW  10/20/21   [provider]  cholecalciferol (VITAMIN D3) 25 MCG (1000 UT) tablet Take 1,000 Units by mouth daily.    [provider]  levonorgestrel (MIRENA) 20 MCG/24HR IUD 1 each by Intrauterine route once.    [provider]  Multiple Vitamin (MULTIVITAMIN PO) Take by mouth.    [provider]  Potassium (POTASSIMIN PO) Take 1 tablet by mouth as needed.    [provider]  UNABLE TO FIND Med Name: emergen-C    [provider]  vitamin E 400 UNIT capsule Take 400 Units by mouth daily.    [provider]    Family History Family History  Problem Relation Age of Onset   Hyperlipidemia Mother     Hypertension Mother    Hyperlipidemia Father    Hypertension Maternal Aunt    Heart disease Maternal Grandfather    Rectal cancer Neg Hx    Stomach cancer Neg Hx     Social History Social History   Tobacco Use   Smoking status: Never   Smokeless tobacco: Never  Vaping Use   Vaping Use: Never used  Substance Use Topics   Alcohol use: Not Currently   Drug use: No     Allergies   Patient has no known allergies.   Review of Systems Review of Systems   Physical Exam Triage Vital Signs ED Triage Vitals  Enc Vitals Group     BP 09/06/22 1131 123/87     Pulse Rate 09/06/22 1131 84     Resp 09/06/22 1131 16     Temp 09/06/22 1131 98.2 F (36.8 C)     Temp Source 09/06/22 1131 Oral     SpO2 09/06/22 1131 99 %     Weight --      Height --      Head Circumference --      Peak Flow --      Pain Score 09/06/22 1132 1     Pain Loc --      Pain Edu? --      Excl. in GC? --    No data found.  Updated Vital Signs BP 123/87 (BP Location: Left Arm)   Pulse 84   Temp 98.2 F (36.8 C) (Oral)   Resp 16   SpO2 99%   Visual Acuity Right Eye Distance:   Left Eye Distance:   Bilateral Distance:    Right Eye Near:   Left Eye Near:    Bilateral Near:     Physical Exam Vitals and nursing note reviewed.  Constitutional:      General: She is not in acute distress.    Appearance: Normal appearance.  HENT:     Head: Normocephalic and atraumatic.  Eyes:     Pupils: Pupils are equal, round, and reactive to light.  Cardiovascular:     Rate and Rhythm: Normal rate and regular rhythm.     Heart sounds: Normal heart sounds. Heart sounds not distant.     No systolic murmur is present.     No diastolic murmur is present.     No friction rub. No gallop. No S3 or S4 sounds.  Pulmonary:     Effort: Pulmonary effort is normal.     Breath sounds: Normal breath sounds.  Chest:     Chest wall: No mass, deformity, tenderness, crepitus or edema. There is no dullness to  percussion.  Musculoskeletal:  General: Normal range of motion.     Cervical back: Normal range of motion and neck supple.  Skin:    General: Skin is warm and dry.  Neurological:     General: No focal deficit present.     Mental Status: She is alert and oriented to person, place, and time. Mental status is at baseline.  Psychiatric:        Mood and Affect: Mood normal.        Behavior: Behavior normal.        Thought Content: Thought content normal.        Judgment: Judgment normal.      UC Treatments / Results  Labs (all labs ordered are listed, but only abnormal results are displayed) Labs Reviewed - No data to display  EKG   Radiology No results found.  Procedures Procedures (including critical care time)  Medications Ordered in UC Medications - No data to display  Initial Impression / Assessment and Plan / UC Course  I have reviewed the triage vital signs and the nursing notes.  Pertinent labs & imaging results that were available during my care of the patient were reviewed by me and considered in my medical decision making (see chart for details).      Patient was advised of EKG findings.  Patient redirected to emergency room for further work-up of nonspecific chest pain.  Patient deemed stable for discharge and transport to ED by private vehicle.  Final Clinical Impressions(s) / UC Diagnoses   Final diagnoses:  Chest pain at rest  History of anterior wall myocardial infarction  Abnormal EKG   Discharge Instructions   None    ED Prescriptions   None    PDMP not reviewed this encounter.   Theadora Rama Scales, PA-C 09/06/22 1152    Theadora Rama Scales, [place] 09/06/22 1209

## 2022-09-06 NOTE — ED Notes (Signed)
Patient is being discharged from the Urgent Care and sent to the Emergency Department via POV. Per L. Morgan-Scales PA-C , patient is in need of higher level of care due to need for further evaluation . Patient is aware and verbalizes understanding of plan of care.  Vitals:   09/06/22 1131  BP: 123/87  Pulse: 84  Resp: 16  Temp: 98.2 F (36.8 C)  SpO2: 99%

## 2022-09-06 NOTE — ED Provider Triage Note (Signed)
Emergency Medicine Provider Triage Evaluation Note  Patty Hale , a 51 y.o. female  was evaluated in triage.  Pt complains of chest pain that started around 10 AM this morning.  She described the pain as dull, constant, nonexertional, nonradiating.  Reports dizziness started this morning.  Denies any fever, shortness of breath, nausea, vomiting, weakness, bowel changes, urinary symptoms, rash.  Review of Systems  Positive: As above Negative: As above  Physical Exam  There were no vitals taken for this visit. Gen:   Awake, no distress   Resp:  Normal effort  MSK:   Moves extremities without difficulty  Other:    Medical Decision Making  Medically screening exam initiated at 12:23 PM.  Appropriate orders placed.  Darliss Ridgel was informed that the remainder of the evaluation will be completed by another provider, this initial triage assessment does not replace that evaluation, and the importance of remaining in the ED until their evaluation is complete.     Jeanelle Malling, [place] 09/07/22 1029

## 2022-09-06 NOTE — ED Triage Notes (Signed)
Pt went to UC for lightheaded, dizziness, and centralized CP w/o radiation  Denies sob UC sent for abnormal EKG

## 2022-09-06 NOTE — Discharge Instructions (Signed)
As discussed, today the generally reassuring.  There is some suspicion for your pain coming from irritation of the esophagus.  Please take all medication as directed and follow-up with your physician.  Return here for concerning changes in your condition.

## 2022-10-21 ENCOUNTER — Ambulatory Visit
Admission: RE | Admit: 2022-10-21 | Discharge: 2022-10-21 | Disposition: A | Discharge status: Home / Self Care | Payer: 59 | Source: Ambulatory Visit | Attending: Obstetrics & Gynecology | Admitting: Obstetrics & Gynecology

## 2022-10-21 DIAGNOSIS — N6320 Unspecified lump in the left breast, unspecified quadrant: Secondary | ICD-10-CM

## 2023-09-02 ENCOUNTER — Ambulatory Visit: Payer: 59 | Admitting: Obstetrics & Gynecology

## 2023-09-07 ENCOUNTER — Ambulatory Visit: Payer: 59 | Admitting: Obstetrics and Gynecology

## 2023-09-09 ENCOUNTER — Encounter: Payer: Self-pay | Admitting: Obstetrics and Gynecology

## 2023-09-09 ENCOUNTER — Ambulatory Visit (INDEPENDENT_AMBULATORY_CARE_PROVIDER_SITE_OTHER): Payer: 59 | Admitting: Obstetrics and Gynecology

## 2023-09-09 VITALS — BP 110/76 | HR 64 | Ht 66.93 in | Wt 149.0 lb

## 2023-09-09 DIAGNOSIS — Z01419 Encounter for gynecological examination (general) (routine) without abnormal findings: Secondary | ICD-10-CM | POA: Insufficient documentation

## 2023-09-09 DIAGNOSIS — R8290 Unspecified abnormal findings in urine: Secondary | ICD-10-CM | POA: Diagnosis not present

## 2023-09-09 DIAGNOSIS — E559 Vitamin D deficiency, unspecified: Secondary | ICD-10-CM | POA: Diagnosis not present

## 2023-09-09 DIAGNOSIS — L720 Epidermal cyst: Secondary | ICD-10-CM

## 2023-09-09 NOTE — Progress Notes (Signed)
52 y.o. G47P2002 female with Mirena IUD (placed 2019) here for annual exam.   No LMP recorded. (Menstrual status: IUD).  Pt c/o odor when she urinates. Pt has a "mole" on top of pelvic area that she is  concerned about. Pt interested in blood work today, currently fasting. Working with a nutritionist  Abnormal bleeding: none Pelvic discharge or pain: none Breast mass, nipple discharge or skin changes : none Birth control: IUD Last PAP:     Component Value Date/Time   DIAGPAP  08/27/2022 0900    - Negative for intraepithelial lesion or malignancy (NILM)   ADEQPAP  08/27/2022 0900    Satisfactory for evaluation; transformation zone component PRESENT.   Last mammogram: 10/21/22 BIRADS 3, density c Last colonoscopy: 11/20/21 Sexually active: no  Exercising: Spin biking   GYN HISTORY: No significant history  OB History  Gravida Para Term Preterm AB Living  2 2 2     2   SAB IAB Ectopic Multiple Live Births          2    # Outcome Date GA Lbr Len/2nd Weight Sex Type Anes PTL Lv  2 Term     M Vag-Spont  N LIV  1 Term     F Vag-Spont  N LIV    Past Medical History:  Diagnosis Date   Arthritis    knees   Dental crowns present    GERD (gastroesophageal reflux disease)    no meds- if no caffeine or chocolate no issues   Lesion of breast 01/2015   left- negative   Vitamin D deficiency     Past Surgical History:  Procedure Laterality Date   BREAST EXCISIONAL BIOPSY Left 2016   BREAST LUMPECTOMY WITH RADIOACTIVE SEED LOCALIZATION Left 01/31/2015   Procedure: BREAST LUMPECTOMY WITH RADIOACTIVE SEED LOCALIZATION;  Surgeon: Chevis Pretty III, MD;  Location: [facility];  Service: General;  Laterality: Left;   INTRAUTERINE DEVICE (IUD) INSERTION     mirena inserted 07-11-17   INTRAUTERINE DEVICE INSERTION  07/17/2008   MIRENA   KNEE ARTHROSCOPY Right 1991   KNEE SURGERY  2019   KNEE SURGERY Left 2021   SHOULDER ARTHROSCOPY WITH ROTATOR CUFF REPAIR AND  SUBACROMIAL DECOMPRESSION Right 02/16/2014   Procedure: RIGHT SHOULDER ARTHROSCOPY WITH DEBRIDEMENT EXTENSIVE/DISTAL CLAVICULECTOMY; SUBACROMIAL DECOMPRESSION;  PARTIAL ACROMIOPLASTY AND OPEN ROTATOR CUFF REPAIR;  Surgeon: Thera Flake., MD;  Location: [facility];  Service: Orthopedics;  Laterality: Right;  ANESTHESIA: GENERAL, PRE/POST OP SCALENE   SHOULDER SURGERY Left 06/08/2022    Current Outpatient Medications on File Prior to Visit  Medication Sig Dispense Refill   ALPRAZolam (XANAX) 0.25 MG tablet Take 0.25 mg by mouth daily as needed.     BIOTIN PO Take by mouth.     cholecalciferol (VITAMIN D3) 25 MCG (1000 UT) tablet Take 1,000 Units by mouth daily.     levonorgestrel (MIRENA) 20 MCG/24HR IUD 1 each by Intrauterine route once.     Multiple Vitamin (MULTIVITAMIN PO) Take by mouth.     pantoprazole (PROTONIX) 20 MG tablet Take 1 tablet (20 mg total) by mouth daily. 21 tablet 0   UNABLE TO FIND Med Name: emergen-C     vitamin E 400 UNIT capsule Take 400 Units by mouth daily.     Potassium (POTASSIMIN PO) Take 1 tablet by mouth as needed.     sucralfate (CARAFATE) 1 g tablet Take 1 tablet (1 g total) by mouth 4 (four) times daily -  with meals and at bedtime. 21 tablet 0   No current facility-administered medications on file prior to visit.    Social History   Socioeconomic History   Marital status: Divorced    Spouse name: Not on file   Number of children: Not on file   Years of education: Not on file   Highest education level: Not on file  Occupational History   Not on file  Tobacco Use   Smoking status: Never   Smokeless tobacco: Never  Vaping Use   Vaping status: Never Used  Substance and Sexual Activity   Alcohol use: Not Currently   Drug use: No   Sexual activity: Not Currently    Partners: Male    Birth control/protection: I.U.D.    Comment: 1st intercourse 51 yo-Fewer than 5 partners  Other Topics Concern   Not on file  Social History  Narrative   Not on file   Social Determinants of Health   Financial Resource Strain: Not on file  Food Insecurity: Not on file  Transportation Needs: Not on file  Physical Activity: Not on file  Stress: Not on file  Social Connections: Unknown (02/27/2022)   [facility] from [facility], [facility]   Social Network    Social Network: Not on file  Intimate Partner Violence: Unknown (01/19/2022)   Received from [facility], [facility]   HITS    Physically Hurt: Not on file    Insult or Talk Down To: Not on file    Threaten Physical Harm: Not on file    Scream or Curse: Not on file    Family History  Problem Relation Age of Onset   Hyperlipidemia Mother    Hypertension Mother    Hyperlipidemia Father    Hypertension Maternal Aunt    Heart disease Maternal Grandfather    Rectal cancer Neg Hx    Stomach cancer Neg Hx     No Known Allergies    PE Today's Vitals   09/09/23 0754  BP: 110/76  Pulse: 64  SpO2: 100%  Weight: 149 lb (67.6 kg)  Height: 5' 6.93" (1.7 m)   Body mass index is 23.39 kg/m.  Physical Exam Vitals reviewed. Exam conducted with a chaperone present.  Constitutional:      General: She is not in acute distress.    Appearance: Normal appearance.  HENT:     Head: Normocephalic and atraumatic.     Nose: Nose normal.  Eyes:     Extraocular Movements: Extraocular movements intact.     Conjunctiva/sclera: Conjunctivae normal.  Neck:     Thyroid: No thyroid mass, thyromegaly or thyroid tenderness.  Pulmonary:     Effort: Pulmonary effort is normal.  Chest:     Chest wall: No mass or tenderness.  Breasts:    Right: Normal. No swelling, mass, nipple discharge, skin change or tenderness.     Left: Normal. No swelling, mass, nipple discharge, skin change or tenderness.  Abdominal:     General: There is no distension.     Palpations: Abdomen is soft.     Tenderness: There is no abdominal tenderness.  Genitourinary:    General: Normal vulva.      Exam position: Lithotomy position.     Urethra: No prolapse.     Vagina: Normal. No vaginal discharge or bleeding.     Cervix: Normal. No lesion.     Uterus: Normal. Not enlarged and not tender.      Adnexa: Right adnexa normal and left  adnexa normal.     Comments: Inclusion cyst of mons Musculoskeletal:        General: Normal range of motion.     Cervical back: Normal range of motion.  Lymphadenopathy:     Upper Body:     Right upper body: No axillary adenopathy.     Left upper body: No axillary adenopathy.     Lower Body: No right inguinal adenopathy. No left inguinal adenopathy.  Skin:    General: Skin is warm and dry.  Neurological:     General: No focal deficit present.     Mental Status: She is alert.  Psychiatric:        Mood and Affect: Mood normal.        Behavior: Behavior normal.       Assessment and Plan:        Abnormal urine odor -     Urinalysis,Complete w/RFL Culture  Vitamin D deficiency -     VITAMIN D 25 Hydroxy (Vit-D Deficiency, Fractures)  Well woman exam with routine gynecological exam Assessment & Plan: Cervical cancer screening performed according to ASCCP guidelines. Encouraged annual mammogram screening Colonoscopy UTD DXA N/S Labs and immunizations with her primary Encouraged safe sexual practices as indicated Encouraged healthy lifestyle practices with diet and exercise For patients under 50-70yo, I recommend 1200mg  calcium daily and 600IU of vitamin D daily.   Orders: -     CBC -     COMPLETE METABOLIC PANEL WITH GFR -     Lipid panel -     Hemoglobin A1C w/out eAG  Inclusion cyst Patient interested in removal, to call when ready  Other orders -     Urine Culture -     REFLEXIVE URINE CULTURE    Rosalyn Gess, MD

## 2023-09-09 NOTE — Patient Instructions (Signed)
For patients under 50-54yo, I recommend 1200mg  calcium daily and 600IU of vitamin D daily. For patients over 54yo, I recommend 1200mg  calcium daily and 800IU of vitamin D daily.  Health Maintenance, Female Adopting a healthy lifestyle and getting preventive care are important in promoting health and wellness. Ask your health care provider about: The right schedule for you to have regular tests and exams. Things you can do on your own to prevent diseases and keep yourself healthy. What should I know about diet, weight, and exercise? Eat a healthy diet  Eat a diet that includes plenty of vegetables, fruits, low-fat dairy products, and lean protein. Do not eat a lot of foods that are high in solid fats, added sugars, or sodium. Maintain a healthy weight Body mass index (BMI) is used to identify weight problems. It estimates body fat based on height and weight. Your health care provider can help determine your BMI and help you achieve or maintain a healthy weight. Get regular exercise Get regular exercise. This is one of the most important things you can do for your health. Most adults should: Exercise for at least 150 minutes each week. The exercise should increase your heart rate and make you sweat (moderate-intensity exercise). Do strengthening exercises at least twice a week. This is in addition to the moderate-intensity exercise. Spend less time sitting. Even light physical activity can be beneficial. Watch cholesterol and blood lipids Have your blood tested for lipids and cholesterol at 54 years of age, then have this test every 5 years. Have your cholesterol levels checked more often if: Your lipid or cholesterol levels are high. You are older than 54 years of age. You are at high risk for heart disease. What should I know about cancer screening? Depending on your health history and family history, you may need to have cancer screening at various ages. This may include screening  for: Breast cancer. Cervical cancer. Colorectal cancer. Skin cancer. Lung cancer. What should I know about heart disease, diabetes, and high blood pressure? Blood pressure and heart disease High blood pressure causes heart disease and increases the risk of stroke. This is more likely to develop in people who have high blood pressure readings or are overweight. Have your blood pressure checked: Every 3-5 years if you are 83-54 years of age. Every year if you are 4 years old or older. Diabetes Have regular diabetes screenings. This checks your fasting blood sugar level. Have the screening done: Once every three years after age 68 if you are at a normal weight and have a low risk for diabetes. More often and at a younger age if you are overweight or have a high risk for diabetes. What should I know about preventing infection? Hepatitis B If you have a higher risk for hepatitis B, you should be screened for this virus. Talk with your health care provider to find out if you are at risk for hepatitis B infection. Hepatitis C Testing is recommended for: Everyone born from [birth year] through 1967. Anyone with known risk factors for hepatitis C. Sexually transmitted infections (STIs) Get screened for STIs, including gonorrhea and chlamydia, if: You are sexually active and are younger than 54 years of age. You are older than 54 years of age and your health care provider tells you that you are at risk for this type of infection. Your sexual activity has changed since you were last screened, and you are at increased risk for chlamydia or gonorrhea. Ask your health care provider if  you are at risk. Ask your health care provider about whether you are at high risk for HIV. Your health care provider may recommend a prescription medicine to help prevent HIV infection. If you choose to take medicine to prevent HIV, you should first get tested for HIV. You should then be tested every 3 months for as long as you  are taking the medicine. Osteoporosis and menopause Osteoporosis is a disease in which the bones lose minerals and strength with aging. This can result in bone fractures. If you are 44 years old or older, or if you are at risk for osteoporosis and fractures, ask your health care provider if you should: Be screened for bone loss. Take a calcium or vitamin D supplement to lower your risk of fractures. Be given hormone replacement therapy (HRT) to treat symptoms of menopause. Follow these instructions at home: Alcohol use Do not drink alcohol if: Your health care provider tells you not to drink. You are pregnant, may be pregnant, or are planning to become pregnant. If you drink alcohol: Limit how much you have to: 0-1 drink a day. Know how much alcohol is in your drink. In the [place] one drink equals one 12 oz bottle of beer (355 mL), one 5 oz glass of wine (148 mL), or one 1 oz glass of hard liquor (44 mL). Lifestyle Do not use any products that contain nicotine or tobacco. These products include cigarettes, chewing tobacco, and vaping devices, such as e-cigarettes. If you need help quitting, ask your health care provider. Do not use street drugs. Do not share needles. Ask your health care provider for help if you need support or information about quitting drugs. General instructions Schedule regular health, dental, and eye exams. Stay current with your vaccines. Tell your health care provider if: You often feel depressed. You have ever been abused or do not feel safe at home. Summary Adopting a healthy lifestyle and getting preventive care are important in promoting health and wellness. Follow your health care provider's instructions about healthy diet, exercising, and getting tested or screened for diseases. Follow your health care provider's instructions on monitoring your cholesterol and blood pressure. This information is not intended to replace advice given to you by your health  care provider. Make sure you discuss any questions you have with your health care provider. Document Revised: 02/24/2021 Document Reviewed: 02/24/2021 Elsevier Patient Education  2024 [employer].

## 2023-09-09 NOTE — Assessment & Plan Note (Signed)
Cervical cancer screening performed according to ASCCP guidelines. Encouraged annual mammogram screening Colonoscopy UTD DXA N/S Labs and immunizations with her primary Encouraged safe sexual practices as indicated Encouraged healthy lifestyle practices with diet and exercise For patients under 50-54yo, I recommend 1200mg  calcium daily and 600IU of vitamin D daily.

## 2023-09-10 LAB — LIPID PANEL
Cholesterol: 183 mg/dL (ref <200)
HDL: 85 mg/dL (ref >=50)
LDL Cholesterol (Calc): 87 mg/dL
Non-HDL Cholesterol (Calc): 98 mg/dL (ref <130)
Total CHOL/HDL Ratio: 2.2 (calc) (ref <5.0)
Triglycerides: 37 mg/dL (ref <150)

## 2023-09-10 LAB — COMPLETE METABOLIC PANEL WITH GFR
AG Ratio: 1.7 (calc) (ref 1.0–2.5)
ALT: 28 U/L (ref 6–29)
AST: 28 U/L (ref 10–35)
Albumin: 4.5 g/dL (ref 3.6–5.1)
Alkaline phosphatase (APISO): 85 U/L (ref 37–153)
BUN/Creatinine Ratio: 19 (calc) (ref 6–22)
BUN: 23 mg/dL (ref 7–25)
CO2: 28 mmol/L (ref 20–32)
Calcium: 10.2 mg/dL (ref 8.6–10.4)
Chloride: 107 mmol/L (ref 98–110)
Creat: 1.24 mg/dL — ABNORMAL HIGH (ref 0.50–1.03)
Globulin: 2.6 g/dL (ref 1.9–3.7)
Glucose, Bld: 84 mg/dL (ref 65–99)
Potassium: 4.8 mmol/L (ref 3.5–5.3)
Sodium: 140 mmol/L (ref 135–146)
Total Bilirubin: 0.6 mg/dL (ref 0.2–1.2)
Total Protein: 7.1 g/dL (ref 6.1–8.1)
eGFR: 52 mL/min/{1.73_m2} — ABNORMAL LOW (ref >=60)

## 2023-09-10 LAB — CBC
HCT: 42.2 % (ref 35.0–45.0)
Hemoglobin: 13.4 g/dL (ref 11.7–15.5)
MCH: 28.5 pg (ref 27.0–33.0)
MCHC: 31.8 g/dL — ABNORMAL LOW (ref 32.0–36.0)
MCV: 89.8 fL (ref 80.0–100.0)
MPV: 12.3 fL (ref 7.5–12.5)
Platelets: 157 10*3/uL (ref 140–400)
RBC: 4.7 10*6/uL (ref 3.80–5.10)
RDW: 13 % (ref 11.0–15.0)
WBC: 4.6 10*3/uL (ref 3.8–10.8)

## 2023-09-10 LAB — VITAMIN D 25 HYDROXY (VIT D DEFICIENCY, FRACTURES): Vit D, 25-Hydroxy: 38 ng/mL (ref 30–100)

## 2023-09-10 LAB — HEMOGLOBIN A1C W/OUT EAG: Hgb A1c MFr Bld: 6 %{Hb} — ABNORMAL HIGH (ref <5.7)

## 2023-09-11 LAB — URINALYSIS, COMPLETE W/RFL CULTURE
Bacteria, UA: NONE SEEN /[HPF]
Bilirubin Urine: NEGATIVE
Casts: NONE SEEN /[LPF]
Crystals: NONE SEEN /[HPF]
Glucose, UA: NEGATIVE
Ketones, ur: NEGATIVE
Leukocyte Esterase: NEGATIVE
Nitrites, Initial: NEGATIVE
Protein, ur: NEGATIVE
Specific Gravity, Urine: 1.015 (ref 1.001–1.035)
WBC, UA: NONE SEEN /[HPF] (ref 0–5)
Yeast: NONE SEEN /[HPF]
pH: 5.5 (ref 5.0–8.0)

## 2023-09-11 LAB — CULTURE INDICATED

## 2023-09-11 LAB — URINE CULTURE
MICRO NUMBER:: 15762173
Result:: NO GROWTH
SPECIMEN QUALITY:: ADEQUATE

## 2023-09-13 ENCOUNTER — Encounter: Payer: Self-pay | Admitting: Obstetrics and Gynecology

## 2023-09-13 DIAGNOSIS — R944 Abnormal results of kidney function studies: Secondary | ICD-10-CM | POA: Insufficient documentation

## 2023-09-15 ENCOUNTER — Encounter: Payer: Self-pay | Admitting: Obstetrics and Gynecology

## 2023-09-15 ENCOUNTER — Ambulatory Visit: Payer: 59 | Admitting: Obstetrics and Gynecology

## 2023-09-15 ENCOUNTER — Other Ambulatory Visit (HOSPITAL_COMMUNITY)
Admission: RE | Admit: 2023-09-15 | Discharge: 2023-09-15 | Disposition: A | Discharge status: Home / Self Care | Payer: 59 | Source: Ambulatory Visit | Attending: Obstetrics and Gynecology | Admitting: Obstetrics and Gynecology

## 2023-09-15 VITALS — BP 118/70 | HR 54 | Wt 152.0 lb

## 2023-09-15 DIAGNOSIS — L720 Epidermal cyst: Secondary | ICD-10-CM

## 2023-09-15 NOTE — Progress Notes (Signed)
53 y.o. [id number] female here for vulvar excision: inclusion cyst.  No LMP recorded. (Menstrual status: IUD).   Birth control: Mirena IUD  Last PAP:    Component Value Date/Time   DIAGPAP  08/27/2022 0900    - Negative for intraepithelial lesion or malignancy (NILM)   ADEQPAP  08/27/2022 0900    Satisfactory for evaluation; transformation zone component PRESENT.   GYN HISTORY: No significant history.  OB History  Gravida Para Term Preterm AB Living  2 2 2     2   SAB IAB Ectopic Multiple Live Births          2    # Outcome Date GA Lbr Len/2nd Weight Sex Type Anes PTL Lv  2 Term     M Vag-Spont  N LIV  1 Term     F Vag-Spont  N LIV    Past Medical History:  Diagnosis Date   Arthritis    knees   Dental crowns present    GERD (gastroesophageal reflux disease)    no meds- if no caffeine or chocolate no issues   Lesion of breast 01/2015   left- negative   Vitamin D deficiency     Past Surgical History:  Procedure Laterality Date   BREAST EXCISIONAL BIOPSY Left 2016   BREAST LUMPECTOMY WITH RADIOACTIVE SEED LOCALIZATION Left 01/31/2015   Procedure: BREAST LUMPECTOMY WITH RADIOACTIVE SEED LOCALIZATION;  Surgeon: Chevis Pretty III, MD;  Location: [facility];  Service: General;  Laterality: Left;   INTRAUTERINE DEVICE (IUD) INSERTION     mirena inserted 07-11-17   INTRAUTERINE DEVICE INSERTION  07/17/2008   MIRENA   KNEE ARTHROSCOPY Right 1991   KNEE SURGERY  2019   KNEE SURGERY Left 2021   SHOULDER ARTHROSCOPY WITH ROTATOR CUFF REPAIR AND SUBACROMIAL DECOMPRESSION Right 02/16/2014   Procedure: RIGHT SHOULDER ARTHROSCOPY WITH DEBRIDEMENT EXTENSIVE/DISTAL CLAVICULECTOMY; SUBACROMIAL DECOMPRESSION;  PARTIAL ACROMIOPLASTY AND OPEN ROTATOR CUFF REPAIR;  Surgeon: Thera Flake., MD;  Location: [facility];  Service: Orthopedics;  Laterality: Right;  ANESTHESIA: GENERAL, PRE/POST OP SCALENE   SHOULDER SURGERY Left 06/08/2022    Current Outpatient  Medications on File Prior to Visit  Medication Sig Dispense Refill   ALPRAZolam (XANAX) 0.25 MG tablet Take 0.25 mg by mouth daily as needed.     BIOTIN PO Take by mouth.     cholecalciferol (VITAMIN D3) 25 MCG (1000 UT) tablet Take 1,000 Units by mouth daily.     levonorgestrel (MIRENA) 20 MCG/24HR IUD 1 each by Intrauterine route once.     Multiple Vitamin (MULTIVITAMIN PO) Take by mouth.     pantoprazole (PROTONIX) 20 MG tablet Take 1 tablet (20 mg total) by mouth daily. 21 tablet 0   Potassium (POTASSIMIN PO) Take 1 tablet by mouth as needed.     sucralfate (CARAFATE) 1 g tablet Take 1 tablet (1 g total) by mouth 4 (four) times daily -  with meals and at bedtime. 21 tablet 0   UNABLE TO FIND Med Name: emergen-C     vitamin E 400 UNIT capsule Take 400 Units by mouth daily.     No current facility-administered medications on file prior to visit.    No Known Allergies    PE Today's Vitals   09/15/23 1457  BP: 118/70  Pulse: (!) 54  SpO2: 100%  Weight: 152 lb (68.9 kg)   Body mass index is 23.86 kg/m.  Physical Exam Vitals reviewed.  Constitutional:  General: She is not in acute distress.    Appearance: Normal appearance.  HENT:     Head: Normocephalic and atraumatic.     Nose: Nose normal.  Eyes:     Extraocular Movements: Extraocular movements intact.     Conjunctiva/sclera: Conjunctivae normal.  Pulmonary:     Effort: Pulmonary effort is normal.  Genitourinary:    Comments: Inclusion cyst of mons pubis, ~67mm Musculoskeletal:        General: Normal range of motion.     Cervical back: Normal range of motion.  Neurological:     General: No focal deficit present.     Mental Status: She is alert.  Psychiatric:        Mood and Affect: Mood normal.        Behavior: Behavior normal.     Procedure: Vulva excision Informed consent:  Discussed risks (infection, pain, bleeding, bruising, redness) and benefits of the procedure, as well as the alternatives.  Informed  consent was obtained. Anesthesia: 4 cc 1% lidocaine  The area was prepared in a standard fashion. Local was injected. Scalpel was used to completely excision the lesion. 4-0 vicryl was used to reapproximate the skin A sterile dressing were applied.   The patient tolerated procedure well. The patient was instructed on post-op care.   Number of lesions removed:  1    Assessment and Plan:        Inclusion cyst -     Surgical pathology  RTO in 2wk for f/u   Rosalyn Gess, MD

## 2023-09-20 LAB — SURGICAL PATHOLOGY

## 2023-09-29 ENCOUNTER — Ambulatory Visit: Payer: 59 | Admitting: Obstetrics and Gynecology

## 2023-09-29 ENCOUNTER — Encounter: Payer: Self-pay | Admitting: Obstetrics and Gynecology

## 2023-09-29 VITALS — BP 110/76 | HR 72 | Wt 150.0 lb

## 2023-09-29 DIAGNOSIS — L720 Epidermal cyst: Secondary | ICD-10-CM

## 2023-09-29 NOTE — Patient Instructions (Signed)
Clean area with soap and water Consider applying neosporin until completely healed.

## 2023-09-29 NOTE — Progress Notes (Signed)
52 y.o. G54P2002 female here for f/u of exdcision of vulvar inclusion cyst. No LMP recorded. (Menstrual status: IUD).   No complaints, soreness or drainage. Planning to visit daughter in [place] for the holidays. Birth control: Mirena IUD  Last PAP:    Component Value Date/Time   DIAGPAP  08/27/2022 0900    - Negative for intraepithelial lesion or malignancy (NILM)   ADEQPAP  08/27/2022 0900    Satisfactory for evaluation; transformation zone component PRESENT.   GYN HISTORY: No significant history.  OB History  Gravida Para Term Preterm AB Living  2 2 2     2   SAB IAB Ectopic Multiple Live Births          2    # Outcome Date GA Lbr Len/2nd Weight Sex Type Anes PTL Lv  2 Term     M Vag-Spont  N LIV  1 Term     F Vag-Spont  N LIV    Past Medical History:  Diagnosis Date   Arthritis    knees   Dental crowns present    GERD (gastroesophageal reflux disease)    no meds- if no caffeine or chocolate no issues   Lesion of breast 01/2015   left- negative   Vitamin D deficiency     Past Surgical History:  Procedure Laterality Date   BREAST EXCISIONAL BIOPSY Left 2016   BREAST LUMPECTOMY WITH RADIOACTIVE SEED LOCALIZATION Left 01/31/2015   Procedure: BREAST LUMPECTOMY WITH RADIOACTIVE SEED LOCALIZATION;  Surgeon: Chevis Pretty III, MD;  Location: [facility];  Service: General;  Laterality: Left;   INTRAUTERINE DEVICE (IUD) INSERTION     mirena inserted 07-11-17   INTRAUTERINE DEVICE INSERTION  07/17/2008   MIRENA   KNEE ARTHROSCOPY Right 1991   KNEE SURGERY  2019   KNEE SURGERY Left 2021   SHOULDER ARTHROSCOPY WITH ROTATOR CUFF REPAIR AND SUBACROMIAL DECOMPRESSION Right 02/16/2014   Procedure: RIGHT SHOULDER ARTHROSCOPY WITH DEBRIDEMENT EXTENSIVE/DISTAL CLAVICULECTOMY; SUBACROMIAL DECOMPRESSION;  PARTIAL ACROMIOPLASTY AND OPEN ROTATOR CUFF REPAIR;  Surgeon: Thera Flake., MD;  Location: [facility];  Service: Orthopedics;  Laterality: Right;   ANESTHESIA: GENERAL, PRE/POST OP SCALENE   SHOULDER SURGERY Left 06/08/2022    Current Outpatient Medications on File Prior to Visit  Medication Sig Dispense Refill   ALPRAZolam (XANAX) 0.25 MG tablet Take 0.25 mg by mouth daily as needed.     BIOTIN PO Take by mouth.     cholecalciferol (VITAMIN D3) 25 MCG (1000 UT) tablet Take 1,000 Units by mouth daily.     levonorgestrel (MIRENA) 20 MCG/24HR IUD 1 each by Intrauterine route once.     Multiple Vitamin (MULTIVITAMIN PO) Take by mouth.     pantoprazole (PROTONIX) 20 MG tablet Take 1 tablet (20 mg total) by mouth daily. 21 tablet 0   Potassium (POTASSIMIN PO) Take 1 tablet by mouth as needed.     sucralfate (CARAFATE) 1 g tablet Take 1 tablet (1 g total) by mouth 4 (four) times daily -  with meals and at bedtime. 21 tablet 0   UNABLE TO FIND Med Name: emergen-C     vitamin E 400 UNIT capsule Take 400 Units by mouth daily.     No current facility-administered medications on file prior to visit.    No Known Allergies    PE Today's Vitals   09/29/23 1200  BP: 110/76  Pulse: 72  SpO2: 99%  Weight: 150 lb (68 kg)    Body mass  index is 23.54 kg/m.  Physical Exam Vitals reviewed.  Constitutional:      General: She is not in acute distress.    Appearance: Normal appearance.  HENT:     Head: Normocephalic and atraumatic.     Nose: Nose normal.  Eyes:     Extraocular Movements: Extraocular movements intact.     Conjunctiva/sclera: Conjunctivae normal.  Pulmonary:     Effort: Pulmonary effort is normal.  Genitourinary:    Comments: Incision healing well. Musculoskeletal:        General: Normal range of motion.     Cervical back: Normal range of motion.  Neurological:     General: No focal deficit present.     Mental Status: She is alert.  Psychiatric:        Mood and Affect: Mood normal.        Behavior: Behavior normal.      Assessment and Plan:        Inclusion cyst  Excision site healing well. Reviewed  benign pathology. Sutures trimmed to skin level. Normal cleaning at this time F/u PRN    Rosalyn Gess, MD

## 2024-02-07 ENCOUNTER — Ambulatory Visit: Admitting: Podiatry

## 2024-02-07 ENCOUNTER — Encounter: Payer: Self-pay | Admitting: Podiatry

## 2024-02-07 DIAGNOSIS — B351 Tinea unguium: Secondary | ICD-10-CM | POA: Diagnosis not present

## 2024-02-07 NOTE — Progress Notes (Signed)
   Chief Complaint  Patient presents with   Nail Problem    RM7: np right foot toenail fungus    Subjective: 53 y.o. female presenting today for concern of possible toenail fungus to the right hallux nail plate.  She says that the distal tip of the nail is somewhat thickened.  She noticed this after removing her nail polish.  No pain.  Past Medical History:  Diagnosis Date   Arthritis    knees   Dental crowns present    GERD (gastroesophageal reflux disease)    no meds- if no caffeine or chocolate no issues   Lesion of breast 01/2015   left- negative   Vitamin D  deficiency     Past Surgical History:  Procedure Laterality Date   BREAST EXCISIONAL BIOPSY Left 2016   BREAST LUMPECTOMY WITH RADIOACTIVE SEED LOCALIZATION Left 01/31/2015   Procedure: BREAST LUMPECTOMY WITH RADIOACTIVE SEED LOCALIZATION;  Surgeon: Lillette Reid III, MD;  Location: [facility];  Service: General;  Laterality: Left;   INTRAUTERINE DEVICE (IUD) INSERTION     mirena  inserted 07-11-17   INTRAUTERINE DEVICE INSERTION  07/17/2008   MIRENA    KNEE ARTHROSCOPY Right 1991   KNEE SURGERY  2019   KNEE SURGERY Left 2021   SHOULDER ARTHROSCOPY WITH ROTATOR CUFF REPAIR AND SUBACROMIAL DECOMPRESSION Right 02/16/2014   Procedure: RIGHT SHOULDER ARTHROSCOPY WITH DEBRIDEMENT EXTENSIVE/DISTAL CLAVICULECTOMY; SUBACROMIAL DECOMPRESSION;  PARTIAL ACROMIOPLASTY AND OPEN ROTATOR CUFF REPAIR;  Surgeon: Forbes Ida., MD;  Location: [facility];  Service: Orthopedics;  Laterality: Right;  ANESTHESIA: GENERAL, PRE/POST OP SCALENE   SHOULDER SURGERY Left 06/08/2022    No Known Allergies  Objective: Physical Exam General: The patient is alert and oriented x3 in no acute distress.  Dermatology: The very distal portion of the nail plate to the right hallux does have some very mild hyperkeratotic discoloration concerning for possible toenail fungus to the distal tip of the toenail plate.  All remaining  nails and integument WNL with exception of callus formation overlying the PIPJ of the fifth digit bilateral  Vascular: Palpable pedal pulses bilaterally. No edema or erythema noted. Capillary refill within normal limits.  Neurological: Grossly intact via light touch  Musculoskeletal Exam: No pedal deformity noted  Assessment: #1 Onychomycosis of toenails #2 hammertoe contracture of the fifth digits bilateral with overlying corn/callus  Plan of Care:  -Patient was evaluated. -Overall the thickening and discoloration to the nail plate is very minimal and isolated to the distal portion of the nail plate.  I do not believe that oral Lamisil is warranted at this time.  Recommend OTC topical antifungal -Tolcylen antifungal topical was dispensed at checkout -Return to clinic as needed   Dot Gazella, DPM [facility]  Dr. Dot Gazella, DPM    [address] [place], [place] [address]                Office [phone]  Fax [phone]

## 2024-07-14 ENCOUNTER — Other Ambulatory Visit: Payer: Self-pay | Admitting: Internal Medicine

## 2024-07-14 DIAGNOSIS — N63 Unspecified lump in unspecified breast: Secondary | ICD-10-CM

## 2024-07-19 ENCOUNTER — Ambulatory Visit: Admitting: Obstetrics and Gynecology

## 2024-07-19 ENCOUNTER — Encounter: Payer: Self-pay | Admitting: Obstetrics and Gynecology

## 2024-07-19 VITALS — BP 98/60 | HR 72 | Temp 97.9°F | Wt 162.0 lb

## 2024-07-19 DIAGNOSIS — L739 Follicular disorder, unspecified: Secondary | ICD-10-CM | POA: Diagnosis not present

## 2024-07-19 NOTE — Progress Notes (Signed)
 53 y.o. [id number] female here for axillary mass. Divorced.  No LMP recorded. (Menstrual status: IUD).   She reports lump under right arm, noticed it two weeks ago. It was painful to touch at first, no pain or redness today. Small bump still present, possible hair bump. Denies itching.  Jan 2024 BIRADS 3 (left breast findings), density c, due for 70yr f/u; has dx MMG scheduled 07/28/24  GYN HISTORY: Left breast lumpectomy, 2016, benign  OB History  Gravida Para Term Preterm AB Living  2 2 2   2   SAB IAB Ectopic Multiple Live Births      2    # Outcome Date GA Lbr Len/2nd Weight Sex Type Anes PTL Lv  2 Term     M Vag-Spont  N LIV  1 Term     F Vag-Spont  N LIV   Past Medical History:  Diagnosis Date   Arthritis    knees   Dental crowns present    GERD (gastroesophageal reflux disease)    no meds- if no caffeine or chocolate no issues   Lesion of breast 01/2015   left- negative   Vitamin D  deficiency    Past Surgical History:  Procedure Laterality Date   BREAST EXCISIONAL BIOPSY Left 2016   BREAST LUMPECTOMY WITH RADIOACTIVE SEED LOCALIZATION Left 01/31/2015   Procedure: BREAST LUMPECTOMY WITH RADIOACTIVE SEED LOCALIZATION;  Surgeon: Deward Null III, MD;  Location: [facility];  Service: General;  Laterality: Left;   INTRAUTERINE DEVICE (IUD) INSERTION     mirena  inserted 07-11-17   INTRAUTERINE DEVICE INSERTION  07/17/2008   MIRENA    KNEE ARTHROSCOPY Right 1991   KNEE SURGERY  2019   KNEE SURGERY Left 2021   SHOULDER ARTHROSCOPY WITH ROTATOR CUFF REPAIR AND SUBACROMIAL DECOMPRESSION Right 02/16/2014   Procedure: RIGHT SHOULDER ARTHROSCOPY WITH DEBRIDEMENT EXTENSIVE/DISTAL CLAVICULECTOMY; SUBACROMIAL DECOMPRESSION;  PARTIAL ACROMIOPLASTY AND OPEN ROTATOR CUFF REPAIR;  Surgeon: LELON JONETTA Shari Mickey., MD;  Location: [facility];  Service: Orthopedics;  Laterality: Right;  ANESTHESIA: GENERAL, PRE/POST OP SCALENE   SHOULDER SURGERY Left 06/08/2022    Current Outpatient Medications on File Prior to Visit  Medication Sig Dispense Refill   ALPRAZolam (XANAX) 0.25 MG tablet Take 0.25 mg by mouth daily as needed.     BIOTIN PO Take by mouth.     cholecalciferol (VITAMIN D3) 25 MCG (1000 UT) tablet Take 1,000 Units by mouth daily.     levonorgestrel  (MIRENA ) 20 MCG/24HR IUD 1 each by Intrauterine route once.     Multiple Vitamin (MULTIVITAMIN PO) Take by mouth.     pantoprazole  (PROTONIX ) 20 MG tablet Take 1 tablet (20 mg total) by mouth daily. 21 tablet 0   UNABLE TO FIND Med Name: emergen-C     vitamin E 400 UNIT capsule Take 400 Units by mouth daily.     No current facility-administered medications on file prior to visit.   No Known Allergies    PE Today's Vitals   07/19/24 0912  BP: 98/60  Pulse: 72  Temp: 97.9 F (36.6 C)  TempSrc: Oral  SpO2: 99%  Weight: 162 lb (73.5 kg)   Body mass index is 25.43 kg/m.  Physical Exam Vitals reviewed.  Constitutional:      General: She is not in acute distress.    Appearance: Normal appearance.  HENT:     Head: Normocephalic and atraumatic.     Nose: Nose normal.  Eyes:     Extraocular Movements: Extraocular movements  intact.     Conjunctiva/sclera: Conjunctivae normal.  Pulmonary:     Effort: Pulmonary effort is normal.  Chest:  Breasts:    Right: Normal. No mass, nipple discharge, skin change or tenderness.     Left: Normal. No mass, nipple discharge, skin change or tenderness.     Comments: Small hair bump of right axilla Musculoskeletal:        General: Normal range of motion.     Cervical back: Normal range of motion.  Lymphadenopathy:     Upper Body:     Right upper body: No supraclavicular or axillary adenopathy.     Left upper body: No supraclavicular or axillary adenopathy.  Neurological:     General: No focal deficit present.     Mental Status: She is alert.  Psychiatric:        Mood and Affect: Mood normal.        Behavior: Behavior normal.       Assessment and Plan:        Folliculitis of right axilla  Conservative therapies reviewed including use of warm compress and trimming instead of shaving. Plan for dx MMG as scheduled, no further imaging of right breast/axilla indicated   Vera LULLA Pa, MD

## 2024-07-28 ENCOUNTER — Ambulatory Visit
Admission: RE | Admit: 2024-07-28 | Discharge: 2024-07-28 | Disposition: A | Discharge status: Home / Self Care | Source: Ambulatory Visit | Attending: Internal Medicine | Admitting: Internal Medicine

## 2024-07-28 DIAGNOSIS — N63 Unspecified lump in unspecified breast: Secondary | ICD-10-CM

## 2024-09-13 ENCOUNTER — Ambulatory Visit: Payer: 59 | Admitting: Obstetrics and Gynecology

## 2024-09-18 ENCOUNTER — Ambulatory Visit (INDEPENDENT_AMBULATORY_CARE_PROVIDER_SITE_OTHER): Payer: 59 | Admitting: Obstetrics and Gynecology

## 2024-09-18 ENCOUNTER — Encounter: Payer: Self-pay | Admitting: Obstetrics and Gynecology

## 2024-09-18 VITALS — BP 108/64 | HR 98 | Temp 97.7°F | Ht 67.0 in | Wt 159.0 lb

## 2024-09-18 DIAGNOSIS — Z975 Presence of (intrauterine) contraceptive device: Secondary | ICD-10-CM

## 2024-09-18 DIAGNOSIS — Z1331 Encounter for screening for depression: Secondary | ICD-10-CM | POA: Diagnosis not present

## 2024-09-18 DIAGNOSIS — Z01419 Encounter for gynecological examination (general) (routine) without abnormal findings: Secondary | ICD-10-CM

## 2024-09-18 NOTE — Progress Notes (Signed)
 53 y.o. G81P2002 female with Mirena  IUD since August 2019 (lost strings, US  2019) here for annual exam. Divorced. PCP: Rexanne Ingle, MD   No LMP recorded. (Menstrual status: IUD).   She reports occasional hot flashes. She reports ongoing abnormal urine odor, evaluated by urology. Neg Ucx last year with me. No recent UTIs. Urine sample provided: no  Abnormal bleeding: none Pelvic discharge or pain: none Breast mass, nipple discharge or skin changes : none  Sexually active: no Birth control: IUD Last PAP:     Component Value Date/Time   DIAGPAP  08/27/2022 0900    - Negative for intraepithelial lesion or malignancy (NILM)   ADEQPAP  08/27/2022 0900    Satisfactory for evaluation; transformation zone component PRESENT.   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 [facility]  PHQ-2 Total Score 0    Flowsheet Row Office Visit from 09/09/2023 in [facility]  PHQ-9 Total Score 0     GYN HISTORY: No significant history  OB History  Gravida Para Term Preterm AB Living  2 2 2   2   SAB IAB Ectopic Multiple Live Births      2    # Outcome Date GA Lbr Len/2nd Weight Sex Type Anes PTL Lv  2 Term     M Vag-Spont  N LIV  1 Term     F Vag-Spont  N LIV   Past Medical History:  Diagnosis Date   Arthritis    knees   Dental crowns present    GERD (gastroesophageal reflux disease)    no meds- if no caffeine or chocolate no issues   Lesion of breast 01/2015   left- negative   Vitamin D  deficiency    Past Surgical History:  Procedure Laterality Date   BREAST EXCISIONAL BIOPSY Left 2016   BREAST LUMPECTOMY WITH RADIOACTIVE SEED LOCALIZATION Left 01/31/2015   Procedure: BREAST LUMPECTOMY WITH RADIOACTIVE SEED LOCALIZATION;  Surgeon: Deward Null III, MD;  Location: [facility];  Service:  General;  Laterality: Left;   INTRAUTERINE DEVICE (IUD) INSERTION     mirena  inserted 07-11-17   INTRAUTERINE DEVICE INSERTION  07/17/2008   MIRENA    KNEE ARTHROSCOPY Right 1991   KNEE SURGERY  2019   KNEE SURGERY Left 2021   SHOULDER ARTHROSCOPY WITH ROTATOR CUFF REPAIR AND SUBACROMIAL DECOMPRESSION Right 02/16/2014   Procedure: RIGHT SHOULDER ARTHROSCOPY WITH DEBRIDEMENT EXTENSIVE/DISTAL CLAVICULECTOMY; SUBACROMIAL DECOMPRESSION;  PARTIAL ACROMIOPLASTY AND OPEN ROTATOR CUFF REPAIR;  Surgeon: LELON JONETTA Shari Mickey., MD;  Location: [facility];  Service: Orthopedics;  Laterality: Right;  ANESTHESIA: GENERAL, PRE/POST OP SCALENE   SHOULDER SURGERY Left 06/08/2022   Current Outpatient Medications on File Prior to Visit  Medication Sig Dispense Refill   ALPRAZolam (XANAX) 0.25 MG tablet Take 0.25 mg by mouth daily as needed.     amoxicillin (AMOXIL) 500 MG capsule Take 500 mg by mouth 3 (three) times daily.     BIOTIN PO Take by mouth.     chlorhexidine  (PERIDEX ) 0.12 % solution 15 mLs.     cholecalciferol (VITAMIN D3) 25 MCG (1000 UT) tablet Take 1,000 Units by mouth daily.     ibuprofen (ADVIL) 800 MG tablet Take 800 mg by mouth.     levonorgestrel  (MIRENA ) 20 MCG/24HR IUD 1 each by Intrauterine route once.  Multiple Vitamin (MULTIVITAMIN PO) Take by mouth.     pantoprazole  (PROTONIX ) 20 MG tablet Take 1 tablet (20 mg total) by mouth daily. 21 tablet 0   vitamin E 400 UNIT capsule Take 400 Units by mouth daily.     UNABLE TO FIND Med Name: emergen-C (Patient not taking: Reported on 09/18/2024)     No current facility-administered medications on file prior to visit.   Social History   Socioeconomic History   Marital status: Divorced    Spouse name: Not on file   Number of children: Not on file   Years of education: Not on file   Highest education level: Not on file  Occupational History   Not on file  Tobacco Use   Smoking status: Never   Smokeless tobacco: Never   Vaping Use   Vaping status: Never Used  Substance and Sexual Activity   Alcohol use: Not Currently   Drug use: No   Sexual activity: Not Currently    Partners: Male    Birth control/protection: I.U.D.    Comment: 1st intercourse 53 yo-Fewer than 5 partners  Other Topics Concern   Not on file  Social History Narrative   Not on file   Social Drivers of Health   Financial Resource Strain: Not on file  Food Insecurity: Not on file  Transportation Needs: Not on file  Physical Activity: Not on file  Stress: Not on file  Social Connections: Not on file  Intimate Partner Violence: Not on file   Family History  Problem Relation Age of Onset   Hyperlipidemia Mother    Hypertension Mother    Diabetes Father    Hyperlipidemia Father    Hypertension Maternal Aunt    Heart disease Maternal Grandfather    Rectal cancer Neg Hx    Stomach cancer Neg Hx    No Known Allergies   PE Today's Vitals   09/18/24 0812  BP: 108/64  Pulse: 98  Temp: 97.7 F (36.5 C)  TempSrc: Oral  SpO2: 97%  Weight: 159 lb (72.1 kg)  Height: 5' 7 (1.702 m)   Body mass index is 24.9 kg/m.  Physical Exam Vitals reviewed. Exam conducted with a chaperone present.  Constitutional:      General: She is not in acute distress.    Appearance: Normal appearance.  HENT:     Head: Normocephalic and atraumatic.     Nose: Nose normal.  Eyes:     Extraocular Movements: Extraocular movements intact.     Conjunctiva/sclera: Conjunctivae normal.  Pulmonary:     Effort: Pulmonary effort is normal.  Chest:     Chest wall: No mass or tenderness.  Breasts:    Right: Normal. No swelling, mass, nipple discharge, skin change or tenderness.     Left: Normal. No swelling, mass, nipple discharge, skin change or tenderness.  Abdominal:     General: There is no distension.     Palpations: Abdomen is soft.     Tenderness: There is no abdominal tenderness.  Genitourinary:    General: Normal vulva.     Exam  position: Lithotomy position.     Urethra: No prolapse.     Vagina: Normal. No vaginal discharge or bleeding.     Cervix: Normal. No lesion.     Uterus: Normal. Not enlarged and not tender.      Adnexa: Right adnexa normal and left adnexa normal.  Musculoskeletal:        General: Normal range of motion.  Cervical back: Normal range of motion.  Lymphadenopathy:     Upper Body:     Right upper body: No axillary adenopathy.     Left upper body: No axillary adenopathy.     Lower Body: No right inguinal adenopathy. No left inguinal adenopathy.  Skin:    General: Skin is warm and dry.  Neurological:     General: No focal deficit present.     Mental Status: She is alert.  Psychiatric:        Mood and Affect: Mood normal.        Behavior: Behavior normal.      Assessment and Plan:        Well woman exam with routine gynecological exam Assessment & Plan: Cervical cancer screening performed according to ASCCP guidelines. Encouraged annual mammogram screening Colonoscopy UTD DXA N/A Labs and immunizations with her primary Encouraged safe sexual practices as indicated Encouraged healthy lifestyle practices with diet and exercise For patients under 50-70yo, I recommend 1200mg  calcium daily and 600IU of vitamin D  daily.    Negative depression screening  IUD (intrauterine device) in place  Continue until 2027  Vera LULLA Pa, MD

## 2024-09-18 NOTE — Patient Instructions (Signed)
 For patients under 50-55yo, I recommend 1200mg  calcium  daily and 600IU of vitamin D daily. For patients over 55yo, I recommend 1200mg  calcium  daily and 800IU of vitamin D daily.  Health Maintenance, Female Adopting a healthy lifestyle and getting preventive care are important in promoting health and wellness. Ask your health care provider about: The right schedule for you to have regular tests and exams. Things you can do on your own to prevent diseases and keep yourself healthy. What should I know about diet, weight, and exercise? Eat a healthy diet  Eat a diet that includes plenty of vegetables, fruits, low-fat dairy products, and lean protein. Do not eat a lot of foods that are high in solid fats, added sugars, or sodium. Maintain a healthy weight Body mass index (BMI) is used to identify weight problems. It estimates body fat based on height and weight. Your health care provider can help determine your BMI and help you achieve or maintain a healthy weight. Get regular exercise Get regular exercise. This is one of the most important things you can do for your health. Most adults should: Exercise for at least 150 minutes each week. The exercise should increase your heart rate and make you sweat (moderate-intensity exercise). Do strengthening exercises at least twice a week. This is in addition to the moderate-intensity exercise. Spend less time sitting. Even light physical activity can be beneficial. Watch cholesterol and blood lipids Have your blood tested for lipids and cholesterol at 54 years of age, then have this test every 5 years. Have your cholesterol levels checked more often if: Your lipid or cholesterol levels are high. You are older than 55 years of age. You are at high risk for heart disease. What should I know about cancer screening? Depending on your health history and family history, you may need to have cancer screening at various ages. This may include screening  for: Breast cancer. Cervical cancer. Colorectal cancer. Skin cancer. Lung cancer. What should I know about heart disease, diabetes, and high blood pressure? Blood pressure and heart disease High blood pressure causes heart disease and increases the risk of stroke. This is more likely to develop in people who have high blood pressure readings or are overweight. Have your blood pressure checked: Every 3-5 years if you are 25-57 years of age. Every year if you are 24 years old or older. Diabetes Have regular diabetes screenings. This checks your fasting blood sugar level. Have the screening done: Once every three years after age 62 if you are at a normal weight and have a low risk for diabetes. More often and at a younger age if you are overweight or have a high risk for diabetes. What should I know about preventing infection? Hepatitis B If you have a higher risk for hepatitis B, you should be screened for this virus. Talk with your health care provider to find out if you are at risk for hepatitis B infection. Hepatitis C Testing is recommended for: Everyone born from [birth year] through 1967. Anyone with known risk factors for hepatitis C. Sexually transmitted infections (STIs) Get screened for STIs, including gonorrhea and chlamydia, if: You are sexually active and are younger than 55 years of age. You are older than 55 years of age and your health care provider tells you that you are at risk for this type of infection. Your sexual activity has changed since you were last screened, and you are at increased risk for chlamydia or gonorrhea. Ask your health care provider if  you are at risk. Ask your health care provider about whether you are at high risk for HIV. Your health care provider may recommend a prescription medicine to help prevent HIV infection. If you choose to take medicine to prevent HIV, you should first get tested for HIV. You should then be tested every 3 months for as long as you  are taking the medicine. Osteoporosis and menopause Osteoporosis is a disease in which the bones lose minerals and strength with aging. This can result in bone fractures. If you are 72 years old or older, or if you are at risk for osteoporosis and fractures, ask your health care provider if you should: Be screened for bone loss. Take a calcium  or vitamin D supplement to lower your risk of fractures. Be given hormone replacement therapy (HRT) to treat symptoms of menopause. Follow these instructions at home: Alcohol use Do not drink alcohol if: Your health care provider tells you not to drink. You are pregnant, may be pregnant, or are planning to become pregnant. If you drink alcohol: Limit how much you have to: 0-1 drink a day. Know how much alcohol is in your drink. In the [place] one drink equals one 12 oz bottle of beer (355 mL), one 5 oz glass of wine (148 mL), or one 1 oz glass of hard liquor (44 mL). Lifestyle Do not use any products that contain nicotine or tobacco. These products include cigarettes, chewing tobacco, and vaping devices, such as e-cigarettes. If you need help quitting, ask your health care provider. Do not use street drugs. Do not share needles. Ask your health care provider for help if you need support or information about quitting drugs. General instructions Schedule regular health, dental, and eye exams. Stay current with your vaccines. Tell your health care provider if: You often feel depressed. You have ever been abused or do not feel safe at home. Summary Adopting a healthy lifestyle and getting preventive care are important in promoting health and wellness. Follow your health care provider's instructions about healthy diet, exercising, and getting tested or screened for diseases. Follow your health care provider's instructions on monitoring your cholesterol and blood pressure. This information is not intended to replace advice given to you by your health  care provider. Make sure you discuss any questions you have with your health care provider. Document Revised: 02/24/2021 Document Reviewed: 02/24/2021 Elsevier Patient Education  2024 [employer].

## 2024-09-18 NOTE — Assessment & Plan Note (Signed)
 Cervical cancer screening performed according to ASCCP guidelines. Encouraged annual mammogram screening Colonoscopy UTD DXA N/A Labs and immunizations with her primary Encouraged safe sexual practices as indicated Encouraged healthy lifestyle practices with diet and exercise For patients under 50-55yo, I recommend 1200mg  calcium daily and 600IU of vitamin D daily.
